# Patient Record
Sex: Female | Born: 1965 | Race: White | Hispanic: No | Marital: Married
Health system: Midwestern US, Community
[De-identification: ages and names within clinical notes are randomized; demographics above are authoritative.]

## PROBLEM LIST (undated history)

## (undated) DIAGNOSIS — I10 Essential (primary) hypertension: Secondary | ICD-10-CM

## (undated) DIAGNOSIS — E288 Other ovarian dysfunction: Secondary | ICD-10-CM

## (undated) DIAGNOSIS — T783XXA Angioneurotic edema, initial encounter: Secondary | ICD-10-CM

## (undated) DIAGNOSIS — N809 Endometriosis, unspecified: Secondary | ICD-10-CM

## (undated) DIAGNOSIS — M359 Systemic involvement of connective tissue, unspecified: Secondary | ICD-10-CM

## (undated) DIAGNOSIS — Z1231 Encounter for screening mammogram for malignant neoplasm of breast: Secondary | ICD-10-CM

## (undated) DIAGNOSIS — N6459 Other signs and symptoms in breast: Principal | ICD-10-CM

## (undated) DIAGNOSIS — N6452 Nipple discharge: Principal | ICD-10-CM

## (undated) DIAGNOSIS — D509 Iron deficiency anemia, unspecified: Principal | ICD-10-CM

## (undated) DIAGNOSIS — D649 Anemia, unspecified: Principal | ICD-10-CM

## (undated) DIAGNOSIS — M8589 Other specified disorders of bone density and structure, multiple sites: Secondary | ICD-10-CM

## (undated) DIAGNOSIS — Z13820 Encounter for screening for osteoporosis: Principal | ICD-10-CM

## (undated) DIAGNOSIS — E060 Acute thyroiditis: Secondary | ICD-10-CM

## (undated) DIAGNOSIS — E030 Congenital hypothyroidism with diffuse goiter: Secondary | ICD-10-CM

## (undated) HISTORY — DX: Essential (primary) hypertension: I10

## (undated) HISTORY — DX: Endometriosis, unspecified: N80.9

## (undated) HISTORY — PX: LAPAROSCOPY: SHX197

## (undated) HISTORY — DX: Other ovarian dysfunction: E28.8

## (undated) HISTORY — PX: CHOLECYSTECTOMY: SHX55

## (undated) HISTORY — DX: Angioneurotic edema, initial encounter: T78.3XXA

---

## 2001-06-25 DIAGNOSIS — E2839 Other primary ovarian failure: Secondary | ICD-10-CM

## 2001-06-25 HISTORY — DX: Other primary ovarian failure: E28.39

## 2010-05-16 MED ORDER — DIATRIZOATE MEGLUMINE & SODIUM 66 %-10 % ORAL SOLN
66-10 % | Freq: Once | ORAL | Status: AC
Start: 2010-05-16 — End: 2010-05-16
  Administered 2010-05-16: 22:00:00 via ORAL

## 2010-05-16 MED ORDER — IOVERSOL 350 MG/ML IV SOLN
350 mg iodine/mL | Freq: Once | INTRAVENOUS | Status: AC
Start: 2010-05-16 — End: 2010-05-16
  Administered 2010-05-16: 22:00:00 via INTRAVENOUS

## 2011-02-15 NOTE — Progress Notes (Signed)
[facility]: Office Visit New Patient H/P    Chief Complaint:    Fatigue, low fever, lymphadenopathy    History of Present Illness:  Pt is 45 yo female who presents as a self referral with abnormal labs, c/o moderate fatigue, glands swelling in axilla and behind knees which are tender to palpation, constant sore throat, and night sweats periodically.  She states that these symptoms have been occuring since 06/2010. She reports previously having EBV mono in 1996.  Pt reports last week she was in [employer] and had a sharp pain in her abdomen that radiated to her left shoulder, became diaphoretic and then felt like she was going to pass out.  She states it lasted approximately 40-45 minutes until she made it home to rest. She is concerned of her long standing symptoms with history of EBV, and worried of potential transformation.    Labs from PCP showed high EBV IgG, high nuclear Ag Ab, low EBV IgM. Normal CBC, normal lymphocyte/monocyte counts, normal LFT.    Review of Systems:  Constitutional Moderate fatigue.  Night sweats periodically. Low fever.  Denies weight loss or appetite changes. Denies anorexia.   HEENT Persistent sore throat. Denies trauma, bluring vision, hearing loss, ear pain, nosebleeds, neck pain and ear discharge.    Skin Denies lesions or rashes.   Lungs Denies shortness of breath, cough, sputum production or hemoptysis.   Cardiovascular Denies chest pain, palpitations, orthopnea, claudication and leg swelling.   Gastrointestinal Recent episode of LUQ pain   GU Denies dysuria, frequency or hesitancy of urination   Neuro Denies headaches, visual changes or ataxia. Denies dizziness, tingling, tremors, sensory change, speech change, focal weakness and headaches.     Hematology Denies nasal/gum bleeding, denies easy bruise   Endo Denies heat/cold intolerance, denies diabetes.   MSK Denies back pain, swollen legs, myalgias and falls.      Psychiatric/Behavioral Denies depression and substance abuse. The patient is not nervous/anxious.       Allergies   Allergen Reactions   ??? Compazine (Prochlorperazine Edisylate) Rash     Past Medical History   Diagnosis Date   ??? Ovarian failure 2002     premature---hormone therapy   ??? Endometriosis 1992     laparoscopy   ??? HTN (hypertension) 06/2010   ??? Cholelithiasis      cholecystectomy     Past Surgical History   Procedure Date   ??? Hx cholecystectomy 2010     cholelithiasis   ??? Hx pelvic laparoscopy 1997     endometriosis     No family history on file.  History     Social History   ??? Marital Status: Married     Spouse Name: N/A     Number of Children: N/A   ??? 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 Smoking status: Never Smoker    ??? Smokeless tobacco: Not on file   ??? Alcohol Use: Yes   ??? Drug Use: Not on file   ??? Sexually Active: Not on file     Other Topics Concern   ??? Not on file     Social History Narrative   ??? No narrative on file         OBJECTIVE:  BP 152/102   Pulse 76   Temp(Src) 97.9 ??F (36.6 ??C) (Oral)   Resp 18  Ht 5\' 1"  (1.549 m)   Wt 124 lb 12.8 oz (56.609 kg)   BMI 23.58 kg/m2   SpO2 100%    Physical Exam:  Constitutional: Oriented to person, place, and time.   Well-developed and well-nourished.    HEENT: Normocephalic and atraumatic. Oropharynx is clear and moist.   Conjunctivae and EOM are normal. Pupils are equal, round, and reactive to light. No scleral icterus. Neck supple.  No JVD present.  No tracheal deviation present. No thyromegaly present.    Lymph node   Posterior cervical lymph nodes < 1 cm, tender.  Cervical left lymph node > 1 cm. Right axillary lymph node 1 cm. Bilateral popliteal lymph nodes tender on palpation. No palpable submandibular, cervical, supraclavicular, axillary and inguinal lymph nodes.    Skin/Breast Left breast, approximately 2 o'clock position, close to axilla fine fullness with tenderness.  Warm and dry.  No bruising and no rash noted.  No erythema.  No pallor.    Respiratory Effort normal and breath sounds normal.  No respiratory distress.  No wheezes.  No rales.  No tenderness.    CVS Normal rate, regular rhythm and normal heart sounds.  Exam reveals no gallop, no friction and no rub.  No murmur heard.   Abdomen Palpable liver, 2 cm.  Positive inguinal lymph nodes bilateral. Tender on palpation.  Soft. Bowel sounds are normal. Exhibits no distension. There is no tenderness. There is no rebound and no guarding.   Neuro Normal reflexes.  No cranial nerve deficit.  Exhibits normal muscle tone, 5 of 5 strength of all extremities.   MSK Normal range of motion.  No edema and no tenderness.   Psych Normal mood, affect, behavior, judgment and thought content        ASSESSMENT:  1. EBV infection  multivitamin (ONE A DAY) tablet, BIEST / PROGESTERONE, DHEA, MICRONIZED, LD, FLOW CYTOMETRY, PERIPH BLOOD, CT NECK CHEST ABD PELV W WO CONT, MAM MAMMO BI SCREENING DIGTL, EBV BY PCR   2. Lymphadenopathy  multivitamin (ONE A DAY) tablet, BIEST / PROGESTERONE, DHEA, MICRONIZED, LD, FLOW CYTOMETRY, PERIPH BLOOD, CT NECK CHEST ABD PELV W WO CONT, MAM MAMMO BI SCREENING DIGTL, EBV BY PCR     Problem List  Date Reviewed: Mar 07, 2011      Codes Class Noted    EBV infection 075  03/07/11        Lymphadenopathy 785.6  Mar 07, 2011          45 yo F of good baseline performance had PMH of EBV infection in 1996, present today to consult for 8 months of fatigue, low fever, night sweats, lymphadenopathy. She also experienced an episode of severe LUQ pain 2 weeks ago. Concern of EBV reactivation was raised.     It is generally uncommon for EBV reactivation in adults, and she has high IgG titer that indicates immunity to EBV. Her low IgM level, normal lymphocyte/monocyte counts do not support she has active EBV infection. We will check EBV DNA by PCR to further clarify. That being said, the chronic fatigue after EBV infection can last for long time.    Meanwhile, the lymphadenopathy, low fever, hepatomegaly without clear evidence of active EBV indicate evaluation for lymphoproliferative disease. Will check CT neck/C/A/P to clarify the extent of lymphadenopathy and organomegaly, peripheral smear and flowcytometry to screen for clonal population.    Her LUQ of right breast has remarkable tenderness without clearly defined nodule. Will have diagnostic mammogram to evaluate the fullness in the area, and get  [place] if mammogram is negative.    PLAN:  - EBV reactivation: EBV PCR  - Adenopathy, hepatomegaly and LUQ pain: CT neck/C/A/P to evaluate bulky lymph nodes and liver/spleen.  - peripheral blood flow to eval monoclonal population. Check LDH.  - Left breast pain and fullness at 2'oclock: mammogram due. Will get US/biopsy if possible  - Symptom control: Supportive care, tylenol as needed            Asencion Noble, M.D.  [facility]  [address]  [place] [address]  Office : [phone]  Fax : [phone]

## 2011-02-15 NOTE — Progress Notes (Signed)
Formatting of this note is different from the original.  Images from the original note were not included.  [facility]: Office Visit New Patient H/P    Chief Complaint:    Fatigue, low fever, lymphadenopathy    History of Present Illness:  Pt is 45 yo female who presents as a self referral with abnormal labs, c/o moderate fatigue, glands swelling in axilla and behind knees which are tender to palpation, constant sore throat, and night sweats periodically.  She states that these symptoms have been occuring since 06/2010. She reports previously having EBV mono in 1996.  Pt reports last week she was in [employer] and had a sharp pain in her abdomen that radiated to her left shoulder, became diaphoretic and then felt like she was going to pass out.  She states it lasted approximately 40-45 minutes until she made it home to rest. She is concerned of her long standing symptoms with history of EBV, and worried of potential transformation.    Labs from PCP showed high EBV IgG, high nuclear Ag Ab, low EBV IgM. Normal CBC, normal lymphocyte/monocyte counts, normal LFT.    Review of Systems:  Constitutional Moderate fatigue.  Night sweats periodically. Low fever.  Denies weight loss or appetite changes. Denies anorexia.   HEENT Persistent sore throat. Denies trauma, bluring vision, hearing loss, ear pain, nosebleeds, neck pain and ear discharge.    Skin Denies lesions or rashes.   Lungs Denies shortness of breath, cough, sputum production or hemoptysis.   Cardiovascular Denies chest pain, palpitations, orthopnea, claudication and leg swelling.   Gastrointestinal Recent episode of LUQ pain   GU Denies dysuria, frequency or hesitancy of urination   Neuro Denies headaches, visual changes or ataxia. Denies dizziness, tingling, tremors, sensory change, speech change, focal weakness and headaches.    Hematology Denies nasal/gum bleeding, denies easy bruise   Endo Denies heat/cold intolerance, denies diabetes.   MSK Denies  back pain, swollen legs, myalgias and falls.    Psychiatric/Behavioral Denies depression and substance abuse. The patient is not nervous/anxious.      Allergies   Allergen Reactions   ? Compazine (Prochlorperazine Edisylate) Rash     Past Medical History   Diagnosis Date   ? Ovarian failure 2002     premature---hormone therapy   ? Endometriosis 1992     laparoscopy   ? HTN (hypertension) 06/2010   ? Cholelithiasis      cholecystectomy     Past Surgical History   Procedure Date   ? Hx cholecystectomy 2010     cholelithiasis   ? Hx pelvic laparoscopy 1997     endometriosis     No family history on file.  History     Social History   ? Marital Status: Married     Spouse Name: N/A     Number of Children: N/A   ? 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 Smoking status: Never Smoker    ? Smokeless tobacco: Not on file   ? Alcohol Use: Yes   ? Drug Use: Not on file   ? Sexually Active: Not on file     Other Topics Concern   ? Not on file     Social History Narrative   ? No narrative on file     OBJECTIVE:  BP 152/102  Pulse 76  Temp(Src) 97.9 F (36.6 C) (Oral)  Resp 18  Ht '5\' 1"'$  (1.549 m)  Wt 124 lb 12.8 oz (56.609 kg)  BMI 23.58 kg/m2  SpO2 100%    Physical Exam:  Constitutional: Oriented to person, place, and time.   Well-developed and well-nourished.    HEENT: Normocephalic and atraumatic. Oropharynx is clear and moist.   Conjunctivae and EOM are normal. Pupils are equal, round, and reactive to light. No scleral icterus. Neck supple.  No JVD present.  No tracheal deviation present. No thyromegaly present.    Lymph node   Posterior cervical lymph nodes < 1 cm, tender.  Cervical left lymph node > 1 cm. Right axillary lymph node 1 cm. Bilateral popliteal lymph nodes tender on palpation. No palpable submandibular, cervical, supraclavicular, axillary and inguinal lymph nodes.   Skin/Breast Left breast, approximately 2 o'clock position, close to axilla  fine fullness with tenderness.  Warm and dry.  No bruising and no rash noted.  No erythema.  No pallor.    Respiratory Effort normal and breath sounds normal.  No respiratory distress.  No wheezes.  No rales.  No tenderness.    CVS Normal rate, regular rhythm and normal heart sounds.  Exam reveals no gallop, no friction and no rub.  No murmur heard.   Abdomen Palpable liver, 2 cm.  Positive inguinal lymph nodes bilateral. Tender on palpation.  Soft. Bowel sounds are normal. Exhibits no distension. There is no tenderness. There is no rebound and no guarding.   Neuro Normal reflexes.  No cranial nerve deficit.  Exhibits normal muscle tone, 5 of 5 strength of all extremities.   MSK Normal range of motion.  No edema and no tenderness.   Psych Normal mood, affect, behavior, judgment and thought content      ASSESSMENT:  1. EBV infection  multivitamin (ONE A DAY) tablet, BIEST / PROGESTERONE, DHEA, MICRONIZED, LD, FLOW CYTOMETRY, PERIPH BLOOD, CT NECK CHEST ABD PELV W WO CONT, MAM MAMMO BI SCREENING DIGTL, EBV BY PCR   2. Lymphadenopathy  multivitamin (ONE A DAY) tablet, BIEST / PROGESTERONE, DHEA, MICRONIZED, LD, FLOW CYTOMETRY, PERIPH BLOOD, CT NECK CHEST ABD PELV W WO CONT, MAM MAMMO BI SCREENING DIGTL, EBV BY PCR     Problem List  Date Reviewed: 11-Mar-2011      Codes Class Noted    EBV infection 075  11-Mar-2011      Lymphadenopathy 785.6  03/11/11       45 yo F of good baseline performance had PMH of EBV infection in 1996, present today to consult for 8 months of fatigue, low fever, night sweats, lymphadenopathy. She also experienced an episode of severe LUQ pain 2 weeks ago. Concern of EBV reactivation was raised.    It is generally uncommon for EBV reactivation in adults, and she has high IgG titer that indicates immunity to EBV. Her low IgM level, normal lymphocyte/monocyte counts do not support she has active EBV infection. We will check EBV DNA by PCR to further clarify. That being said, the chronic fatigue after  EBV infection can last for long time.    Meanwhile, the lymphadenopathy, low fever, hepatomegaly without clear evidence of active EBV indicate evaluation for lymphoproliferative disease. Will check CT neck/C/A/P to clarify the extent of lymphadenopathy and organomegaly, peripheral smear and flowcytometry to screen for clonal population.    Her LUQ of right breast has remarkable tenderness without clearly defined nodule. Will have diagnostic mammogram to evaluate the fullness in the area, and get  [place] if mammogram is negative.    PLAN:  - EBV reactivation: EBV PCR  - Adenopathy, hepatomegaly and LUQ pain: CT neck/C/A/P to evaluate bulky lymph nodes and liver/spleen.  - peripheral blood flow to eval monoclonal population. Check LDH.  - Left breast pain and fullness at 2'oclock: mammogram due. Will get US/biopsy if possible  - Symptom control: Supportive care, tylenol as needed        Lindalou Hose, M.D.  [facility]  [id number]  [place] [address]  Office : [phone]  Fax : [phone]  Electronically signed by Steele Berg, MD at 02/15/2011  8:45 PM EDT

## 2011-02-19 MED ORDER — IOVERSOL 350 MG/ML IV SOLN
350 mg iodine/mL | Freq: Once | INTRAVENOUS | Status: AC
Start: 2011-02-19 — End: 2011-02-19
  Administered 2011-02-19: 13:00:00 via INTRAVENOUS

## 2011-02-19 MED ORDER — DIATRIZOATE MEGLUMINE & SODIUM 66 %-10 % ORAL SOLN
66-10 % | Freq: Once | ORAL | Status: AC
Start: 2011-02-19 — End: 2011-02-19
  Administered 2011-02-19: 13:00:00 via ORAL

## 2011-02-22 LAB — CBC WITH AUTOMATED DIFF
ABS. BASOPHILS: 0 10*3/uL (ref 0.0–0.2)
ABS. EOSINOPHILS: 0 10*3/uL (ref 0.0–0.8)
ABS. IMM. GRANS.: 0 10*3/uL (ref 0.0–0.5)
ABS. LYMPHOCYTES: 1.7 10*3/uL (ref 0.5–4.6)
ABS. MONOCYTES: 0.3 10*3/uL (ref 0.1–1.3)
ABS. NEUTROPHILS: 3 10*3/uL (ref 1.7–8.2)
BASOPHILS: 0 % (ref 0.0–2.0)
EOSINOPHILS: 1 % (ref 0.5–7.8)
HCT: 41.3 % (ref 35.8–46.3)
HGB: 14.3 g/dL (ref 11.7–15.4)
IMMATURE GRANULOCYTES: 0.2 % (ref 0.0–5.0)
LYMPHOCYTES: 33 % (ref 13–44)
MCH: 29.5 PG (ref 26.1–32.9)
MCHC: 34.6 g/dL (ref 31.4–35.0)
MCV: 85.2 FL (ref 79.6–97.8)
MONOCYTES: 6 % (ref 4.0–12.0)
MPV: 9.7 FL — ABNORMAL LOW (ref 10.8–14.1)
NEUTROPHILS: 60 % (ref 43–78)
PLATELET: 255 10*3/uL (ref 150–450)
RBC: 4.85 M/uL (ref 4.05–5.25)
RDW: 12.2 % (ref 11.9–14.6)
WBC: 5 10*3/uL (ref 4.3–11.1)

## 2011-02-22 LAB — BNP: BNP: 28 pg/mL

## 2011-02-22 LAB — CK: CK: 142 U/L (ref 21–215)

## 2011-02-22 LAB — LIPASE: Lipase: 159 U/L (ref 73–393)

## 2011-02-22 LAB — EKG, 12 LEAD, INITIAL
Atrial Rate: 80 {beats}/min
Calculated P Axis: 41 degrees
Calculated R Axis: 39 degrees
Calculated T Axis: 56 degrees
P-R Interval: 122 ms
Q-T Interval: 374 ms
QRS Duration: 74 ms
QTC Calculation (Bezet): 431 ms
Ventricular Rate: 80 {beats}/min

## 2011-02-22 LAB — METABOLIC PANEL, COMPREHENSIVE
A-G Ratio: 1.1 — ABNORMAL LOW (ref 1.2–3.5)
ALT (SGPT): 40 U/L (ref 12–65)
AST (SGOT): 28 U/L (ref 15–37)
Albumin: 4.7 g/dL (ref 3.5–5.0)
Alk. phosphatase: 73 U/L (ref 50–136)
Anion gap: 4 mmol/L — ABNORMAL LOW (ref 7–16)
BUN: 15 MG/DL (ref 6–23)
Bilirubin, total: 0.5 MG/DL (ref 0.2–1.1)
CO2: 31 MMOL/L (ref 21–32)
Calcium: 10 MG/DL (ref 8.3–10.4)
Chloride: 103 MMOL/L (ref 98–107)
Creatinine: 0.8 MG/DL (ref 0.6–1.0)
GFR est AA: >60 mL/min/{1.73_m2} (ref >60)
GFR est non-AA: >60 mL/min/{1.73_m2} (ref >60)
Globulin: 4.2 g/dL — ABNORMAL HIGH (ref 2.3–3.5)
Glucose: 80 MG/DL (ref 65–100)
Potassium: 3.7 MMOL/L (ref 3.5–5.1)
Protein, total: 8.9 g/dL — ABNORMAL HIGH (ref 6.3–8.2)
Sodium: 138 MMOL/L (ref 136–145)

## 2011-02-22 LAB — D DIMER: D DIMER: <0.19 ug/ml(FEU) (ref <0.55)

## 2011-02-22 LAB — POC TROPONIN
Troponin-I (POC): 0 ng/ml (ref 0.0–0.08)
Troponin-I (POC): 0 ng/ml (ref 0.0–0.08)

## 2011-02-22 LAB — D-DIMER, QUANTITATIVE: D-Dimer, Quant: <0.19 ug/ml(FEU) (ref <0.55)

## 2011-02-22 MED ORDER — ASPIRIN 81 MG CHEWABLE TAB
81 mg | ORAL | Status: AC
Start: 2011-02-22 — End: 2011-02-22
  Administered 2011-02-22: 15:00:00 via ORAL

## 2011-02-22 MED ORDER — SODIUM CHLORIDE 0.9% BOLUS IV
0.9 % | Freq: Once | INTRAVENOUS | Status: AC
Start: 2011-02-22 — End: 2011-02-22
  Administered 2011-02-22: 15:00:00 via INTRAVENOUS

## 2011-02-22 MED ORDER — ACETAMINOPHEN 325 MG TABLET
325 mg | ORAL | Status: AC
Start: 2011-02-22 — End: 2011-02-22
  Administered 2011-02-22: 16:00:00 via ORAL

## 2011-02-22 MED ORDER — NITROGLYCERIN 0.4 MG SUBLINGUAL TAB
0.4 mg | SUBLINGUAL | Status: DC | PRN
Start: 2011-02-22 — End: 2011-02-22
  Administered 2011-02-22: 14:00:00 via SUBLINGUAL

## 2011-02-22 NOTE — ED Notes (Signed)
She declines additional NTG "one did it". States pressure is "essentially all gone". She does c/o headache, order rec'd.

## 2011-02-22 NOTE — H&P (Addendum)
[facility]    Admitting Cardiologist: Rylie Knierim    Primary Cardiologist: n/a    Primary Care Physician: Hemberis    Subjective:     Danielle Powell is a 45 y.o. white female who  presents with c/o left sided chest pressure starting yesterday.  Intermittent with no exacerbating or alleviating factors.  No associated dyspnea, n/v, or diaphoresis.  Not exertional.   8 months hx of fatigue and chronic lymph node swelling/pain, especially left axillary. 2 days ago had near syncopal episode while at [facility].  proceeded by LUQ abd pain, diaphoresis.  No LOC.  Currently being worked up by PCP for lymphnod swelling/pain, had negative mammogram, abd/pelvice ct. Holter monitor worn for two days--results pending. Today with neg trop and neg d-dimer.  Recently started new job.  PCP d'c''d [facility] 06/2010 at onset of fatigue.        Past Medical History   Diagnosis Date   ??? Ovarian failure 2002     premature---hormone therapy   ??? Endometriosis 1992     laparoscopy   ??? HTN (hypertension) 06/2010   ??? Cholelithiasis      cholecystectomy         Mono                                                                                                                 1990's      Past Surgical History   Procedure Date   ??? Hx cholecystectomy 2010     cholelithiasis   ??? Hx pelvic laparoscopy 1997     endometriosis      Current Facility-Administered Medications   Medication Dose Route Frequency   ??? nitroglycerin (NITROSTAT) tablet 0.4 mg  0.4 mg SubLINGual Q5MIN PRN   ??? aspirin chewable tablet 324 mg  324 mg Oral NOW   ??? sodium chloride 0.9 % bolus infusion 1,000 mL  1,000 mL IntraVENous ONCE   ??? acetaminophen (TYLENOL) tablet 650 mg  650 mg Oral NOW     Current Outpatient Prescriptions   Medication Sig   ??? multivitamin (ONE A DAY) tablet Take 1 Tab by mouth daily.     ??? BIEST / PROGESTERONE Apply 1.75 mg to affected area daily. BiEst  mg / Progesterone  mg  22mg  of progesterone     ??? DHEA, MICRONIZED 10 mg by Does Not Apply route daily. cream      Allergies   Allergen Reactions   ??? Compazine (Prochlorperazine Edisylate) Rash      History   Substance Use Topics   ??? Smoking status: Never Smoker    ??? Smokeless tobacco: Not on file   ??? Alcohol Use: Yes      [place] family history of premature CAD--father deceased MI in 45's    Review of Systems  Gen: Denies fever, chills, malaise or fatigue. Appetite good.   HEENT: Denies frequent headaches, dizzyness, visual disturbances, Neck pain or swallowing difficulty  Lungs: Denies shortness of breath, hx of COPD, breathing problems  Cardiovascular: +  chest pressure, orthopnea, PND, +near syncope  GI: Denies hememesis, dark tarry stools, No prior Hx of GI bleed, Denies constipation  GU: Denies dysuria, no complaints of frequency, nocturia  Heme: No prior bleeding disorders, no prior Cancer  Neuro: Denies prior CVA, TIA.  Endocrine: no diabetes, thyroid disorders  Psychiatric: Denies anxiety, or other psychiatric illnesses.   Immunology: + lymph node swelling/pain        Objective:     BP 150/84   Pulse 61   Temp 97.6 ??F (36.4 ??C)   Resp 19   Ht 5' (1.524 m)   Wt 56.7 kg (125 lb)   BMI 24.41 kg/m2   SpO2 99%             General:Alert, cooperative, no distress, appears stated age.Head: Normocephalic, without obvious abnormality, atraumatic. Eyes: Conjunctivae/corneas clear. PERRL, EOMs intact. Fundi benign   Ears: Normal TMs and external ear canals both ears. Nose:Nares normal. Septum midline. Mucosa normal. No drainage or sinus tenderness. Throat: Lips, mucosa, and tongue normal. Teeth and gums normal. Neck: Supple, symmetrical, trachea midline, no adenopathy, thyroid: no enlargment/tenderness/nodules, no carotid bruit and no JVD. Lungs:Clear to auscultation bilaterally.  Chest wall: No tenderness or deformity  Heart: Regular rate and rhythm, S1, S2 normal, no murmur, click, rub or gallop. Abdomen:Soft, non-tender. Bowel sounds normal. No masses, No organomegaly. Extremities: Extremities normal, atraumatic, no cyanosis or edema.Pulses: 2+ and symmetric all extremities. Skin: Skin color, texture, turgor normal. No rashes or lesions                                 Lymph nodes: Cervical, supraclavicular,  nodes normal, axillary nodes TTP. Neurologic:CNII-XII intact. Normal strength, sensation and reflexes throughout.     ECG: NS, no ectopy noted    Data Review:     Recent Results (from the past 24 hour(s))   EKG, 12 LEAD, INITIAL    Collection Time    02/22/11 10:46 AM       Component Value Range    Systolic BP        Diastolic BP        Ventricular Rate 80      Atrial Rate 80      P-R Interval 122      QRS Duration 74      Q-T Interval 374      QTC Calculation (Bezet) 431      Calculated P Axis 41      Calculated R Axis 39      Calculated T Axis 56      Diagnosis        Value: !! AGE AND GENDER SPECIFIC ECG ANALYSIS !!      Normal sinus rhythm      Normal ECG      No previous ECGs available      Confirmed by [facility]  MD (PP), WAYNE M ([id number]) on 02/22/2011 11:58:05 AM   CBC WITH AUTOMATED DIFF    Collection Time    02/22/11 10:50 AM       Component Value Range    WBC 5.0  4.3 - 11.1 (K/uL)    RBC 4.85  4.05 - 5.25 (M/uL)     HGB 14.3  11.7 - 15.4 (g/dL)    HCT 16.1  09.6 - 04.5 (%)    MCV 85.2  79.6 - 97.8 (FL)    MCH 29.5  26.1 - 32.9 (PG)  MCHC 34.6  31.4 - 35.0 (g/dL)    RDW 16.1  09.6 - 04.5 (%)    PLATELET 255  150 - 450 (K/uL)    MPV 9.7 (*) 10.8 - 14.1 (FL)    DF AUTOMATED      NEUTROPHILS 60  43 - 78 (%)    LYMPHOCYTES 33  13 - 44 (%)    MONOCYTES 6  4.0 - 12.0 (%)    EOSINOPHILS 1  0.5 - 7.8 (%)    BASOPHILS 0  0.0 - 2.0 (%)    IMMATURE GRANULOCYTES 0.2  0.0 - 5.0 (%)    ABS. NEUTROPHILS 3.0  1.7 - 8.2 (K/UL)    ABS. LYMPHOCYTES 1.7  0.5 - 4.6 (K/UL)    ABS. MONOCYTES 0.3  0.1 - 1.3 (K/UL)    ABS. EOSINOPHILS 0.0  0.0 - 0.8 (K/UL)    ABS. BASOPHILS 0.0  0.0 - 0.2 (K/UL)    ABS. IMM. GRANS. 0.0  0.0 - 0.5 (K/UL)   METABOLIC PANEL, COMPREHENSIVE    Collection Time    02/22/11 10:50 AM       Component Value Range    Sodium 138  136 - 145 (MMOL/L)    Potassium 3.7  3.5 - 5.1 (MMOL/L)    Chloride 103  98 - 107 (MMOL/L)    CO2 31  21 - 32 (MMOL/L)    Anion gap 4 (*) 7 - 16 (mmol/L)    Glucose 80  65 - 100 (MG/DL)    BUN 15  6 - 23 (MG/DL)    Creatinine 0.8  0.6 - 1.0 (MG/DL)    GFR est AA >40  >98 (ml/min/1.55m2)    GFR est non-AA >60  >60 (ml/min/1.44m2)    Calcium 10.0  8.3 - 10.4 (MG/DL)    Bilirubin, total 0.5  0.2 - 1.1 (MG/DL)    ALT 40  12 - 65 (U/L)    AST 28  15 - 37 (U/L)    Alk. phosphatase 73  50 - 136 (U/L)    Protein, total 8.9 (*) 6.3 - 8.2 (g/dL)    Albumin 4.7  3.5 - 5.0 (g/dL)    Globulin 4.2 (*) 2.3 - 3.5 (g/dL)    A-G Ratio 1.1 (*) 1.2 - 3.5 ( )   CK    Collection Time    02/22/11 10:50 AM       Component Value Range    CK 142  21 - 215 (U/L)   LIPASE    Collection Time    02/22/11 10:50 AM       Component Value Range    Lipase 159  73 - 393 (U/L)   D DIMER    Collection Time    02/22/11 10:50 AM       Component Value Range    D DIMER <0.19  <0.55 (ug/ml(FEU))   BNP    Collection Time    02/22/11 10:50 AM       Component Value Range    BNP 28     POC TROPONIN-I    Collection Time    02/22/11 10:57 AM        Component Value Range    Troponin-I (POC) 0  0.0 - 0.08 (ng/ml)         Assessment / Plan     Active Problems:   Chest pain- stable, resolved after 1 SL nitroglycerin.  Obtain Stress/echo today.  Continue serial trop x 1.---patient was able to perform greater  than ten minutes of Bruce Exercise Stress protocol achieving greater than target heart rate. No acute ST/ T wave changes, no chest pain. Echo images noting no wall motion abnormality. She is discharged to home with Rx for Omeprazole and SL ntg with follow up appt with Dr. Einar Pheasant in 4 weeks.      Stated Labile BP--unclear etiology---will monitor with Stress echo today. BP today was stable--max bp with exercise 160/80. Appropriate response.     MICHAEL D CADE, NP    ATTENDING ADDENDUM:    Patient seen and examined by me.  Agree with above note by physician extender.  Key findings are:  No CP or DOE at present but chest pressure that needs evaluation in light of recurrent nature and hormone replacement therapy.  We will proceed with stress echo and echo today as long as enzymes negative.   CV- RRR without murmur  Lungs- Clear bilaterally  Abd- soft, nontender, nondistended  Ext- no edema  Assessment:    Atypical chest pain  Hypertension, ? New onset vs. Anxiety  Lymphadenopathy  Plan: As above.  If stress echo negative, we will send home with PPI, ECASA 81mg  daily and SL NTG PRN.  If more pain, consider definitive cath.    She will keep a home BP log for my review at follow up visit in several weeks.     Leonarda Salon, MD  [facility]  Pager [phone]

## 2011-02-22 NOTE — ED Provider Notes (Signed)
HPI Comments: States intermittently for several days.    Patient is a 45 y.o. female presenting with chest pain. The history is provided by the patient.   Chest Pain (Angina)   This is a recurrent problem. The current episode started more than 2 days ago. The problem has not changed since onset.Duration of episode(s) is 1 hour. The problem occurs daily. The pain is associated with normal activity. The pain is present in the substernal region. The pain is moderate. The quality of the pain is described as pressure-like. The pain radiates to the left jaw. Associated symptoms include dizziness and nausea. Pertinent negatives include no fever, no lower extremity edema, no shortness of breath and no vomiting. She has tried nothing for the symptoms. Risk factors include hormone replacement therapy and family history.        Past Medical History   Diagnosis Date   ??? Ovarian failure 2002     premature---hormone therapy   ??? Endometriosis 1992     laparoscopy   ??? HTN (hypertension) 06/2010   ??? Cholelithiasis      cholecystectomy        Past Surgical History   Procedure Date   ??? Hx cholecystectomy 2010     cholelithiasis   ??? Hx pelvic laparoscopy 1997     endometriosis         No family history on file.     History     Social History   ??? Marital Status: Married     Spouse Name: N/A     Number of Children: N/A   ??? 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 Smoking status: Never Smoker    ??? Smokeless tobacco: Not on file   ??? Alcohol Use: Yes   ??? Drug Use: Not on file   ??? Sexually Active: Not on file     Other Topics Concern   ??? Not on file     Social History Narrative   ??? No narrative on file                  ALLERGIES: Compazine      Review of Systems   Constitutional: Negative for fever.   Respiratory: Negative for shortness of breath.    Cardiovascular: Positive for chest pain.   Gastrointestinal: Positive for nausea. Negative for vomiting.    Neurological: Positive for dizziness.   All other systems reviewed and are negative.        Filed Vitals:    02/22/11 1043 02/22/11 1045   BP: 170/101    Pulse: 91    Temp:  97.6 ??F (36.4 ??C)   Resp: 18    Height: 5' (1.524 m)    Weight: 56.7 kg (125 lb)    SpO2: 99%             Physical Exam   Nursing note and vitals reviewed.  Constitutional: She is oriented to person, place, and time. She appears well-developed and well-nourished.   HENT:   Head: Normocephalic and atraumatic.   Eyes: Conjunctivae are normal.   Neck: Neck supple.   Cardiovascular: Normal rate and regular rhythm.    Pulmonary/Chest: Effort normal and breath sounds normal.   Abdominal: Soft. There is no tenderness.   Musculoskeletal: Normal range of motion.   Neurological: She is alert and oriented to person, place,  and time.   Skin: Skin is warm and dry.        MDM     Differential Diagnosis; Clinical Impression; Plan:     Pt with 2 risk factors, d/w cards, will do stress echo and dispo from cards.  Amount and/or Complexity of Data Reviewed:   Clinical lab tests:  Ordered and reviewed   Review and summarize past medical records:  Yes   Discuss the patient with another provider:  Yes   Independant visualization of image, tracing, or specimen:  Yes  Risk of Significant Complications, Morbidity, and/or Mortality:   Presenting problems:  High  Diagnostic procedures:  High  Management options:  High  Progress:   Patient progress:  Stable      Procedures

## 2011-02-22 NOTE — ED Notes (Signed)
Pt to other dept for stress echo.

## 2011-02-28 MED ORDER — ALPRAZOLAM 1 MG TAB
1 mg | ORAL_TABLET | Freq: Every evening | ORAL | Status: AC | PRN
Start: 2011-02-28 — End: ?

## 2011-02-28 NOTE — Progress Notes (Signed)
[facility]: Office Visit New Patient H/P    Chief Complaint:    Fatigue, low fever, lymphadenopathy    History of Present Illness:  Pt is 45 yo female who presents as a self referral with abnormal labs, c/o moderate fatigue, glands swelling in axilla and behind knees which are tender to palpation, constant sore throat, and night sweats periodically.  She states that these symptoms have been occuring since 06/2010. She reports previously having EBV mono in 1996.  Pt reports last week she was in [employer] and had a sharp pain in her abdomen that radiated to her left shoulder, became diaphoretic and then felt like she was going to pass out.  She states it lasted approximately 40-45 minutes until she made it home to rest. She is concerned of her long standing symptoms with history of EBV, and worried of potential transformation.    Labs from PCP showed high EBV IgG, high nuclear Ag Ab, low EBV IgM. Normal CBC, normal lymphocyte/monocyte counts, normal LFT.    In the initial counseling we had systemic workup to assess potential EBV reactivation or lymphoplastic disease. EBV PCR is negative. Peripheral flow cytometry showed no evidence of clonal cells. CT neck/chest/abdomen did not reveal pathological LAD. Mammogram showed no evidence of breast lesion.    She went to ER on 8/30 with c/o feeling dizzy, "loopy", and elevated BP.  Cardiac tests were negative.  BP remains elevated today and she reports it has been for the past few days. Admits anxiety.    Review of Systems:  Constitutional Moderate fatigue.  Night sweats periodically. Low fever.  Denies weight loss or appetite changes. Denies anorexia.   HEENT Persistent sore throat. Denies trauma, bluring vision, hearing loss, ear pain, nosebleeds, neck pain and ear discharge.    Skin Denies lesions or rashes.   Lungs Denies shortness of breath, cough, sputum production or hemoptysis.    Cardiovascular Denies chest pain, palpitations, orthopnea, claudication and leg swelling.   Gastrointestinal Recent episode of LUQ pain   GU Denies dysuria, frequency or hesitancy of urination   Neuro Denies headaches, visual changes or ataxia. Denies dizziness, tingling, tremors, sensory change, speech change, focal weakness and headaches.     Hematology Denies nasal/gum bleeding, denies easy bruise   Endo Denies heat/cold intolerance, denies diabetes.   MSK Denies back pain, swollen legs, myalgias and falls.     Psychiatric/Behavioral Denies depression and substance abuse. The patient is not nervous/anxious.       Allergies   Allergen Reactions   ??? Compazine (Prochlorperazine Edisylate) Rash     Past Medical History   Diagnosis Date   ??? Ovarian failure 2002     premature---hormone therapy   ??? Endometriosis 1992     laparoscopy   ??? HTN (hypertension) 06/2010   ??? Cholelithiasis      cholecystectomy     Past Surgical History   Procedure Date   ??? Hx cholecystectomy 2010     cholelithiasis   ??? Hx pelvic laparoscopy 1997     endometriosis     No family history on file.  History     Social History   ??? Marital Status: Married     Spouse Name: N/A     Number of Children: N/A   ??? 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 Smoking status: Never Smoker    ???  Smokeless tobacco: Not on file   ??? Alcohol Use: Yes   ??? Drug Use: Not on file   ??? Sexually Active: Not on file     Other Topics Concern   ??? Not on file     Social History Narrative   ??? No narrative on file         OBJECTIVE:  BP 147/110   Pulse 66   Temp(Src) 97.9 ??F (36.6 ??C) (Oral)   Resp 18   Ht 5' (1.524 m)   Wt 122 lb (55.339 kg)   BMI 23.83 kg/m2   SpO2 100%   LMP 06/25/2001    Physical Exam:  Constitutional: Oriented to person, place, and time.   Well-developed and well-nourished.    HEENT: Normocephalic and atraumatic. Oropharynx is clear and moist.    Conjunctivae and EOM are normal. Pupils are equal, round, and reactive to light. No scleral icterus. Neck supple.  No JVD present.  No tracheal deviation present. No thyromegaly present.    Lymph node   Posterior cervical lymph nodes < 1 cm, tender.   Right axillary lymph node <1 cm. Bilateral popliteal lymph nodes tender on palpation. No palpable submandibular, cervical, supraclavicular, axillary and inguinal lymph nodes.   Skin/Breast Left breast, approximately 2 o'clock position, close to axilla 0.5cm nodule with tenderness.  Warm and dry.  No bruising and no rash noted.  No erythema.  No pallor.    Respiratory Effort normal and breath sounds normal.  No respiratory distress.  No wheezes.  No rales.  No tenderness.    CVS Normal rate, regular rhythm and normal heart sounds.  Exam reveals no gallop, no friction and no rub.  No murmur heard.   Abdomen Palpable liver, 2 cm.  Positive inguinal lymph nodes bilateral. Tender on palpation.  Soft. Bowel sounds are normal. Exhibits no distension. There is no tenderness. There is no rebound and no guarding.   Neuro Normal reflexes.  No cranial nerve deficit.  Exhibits normal muscle tone, 5 of 5 strength of all extremities.   MSK Normal range of motion.  No edema and no tenderness.   Psych Normal mood, affect, behavior, judgment and thought content        ASSESSMENT:  1. Breast nodule  US BREAST LT, REFERRAL TO GENERAL SURGERY     Problem List  Date Reviewed: March 02, 2011      Codes Class Noted    EBV infection 075  2011-03-02        Lymphadenopathy 785.6  03/02/2011          45 yo F of good baseline performance had PMH of EBV infection in 1996, present today to consult for 8 months of fatigue, low fever, night sweats, lymphadenopathy. She also experienced an episode of severe LUQ pain 2 weeks ago. Concern of EBV reactivation was raised.     It is generally uncommon for EBV reactivation in adults, and she has high IgG titer that indicates immunity to EBV. Her low IgM level, normal lymphocyte/monocyte counts do not support she has active EBV infection. We checked EBV DNA by PCR and further ruled out reactivation. She probably has chronic fatigue, which can last for long time after EBV infection. We also ruled out lymphoproliferative disease with negative CT and flow cytometry.    Her LUQ of right breast has remarkable tenderness with a 0.5 cm small nodule palpable and tender, but negative mammogram. I marked the site and ordered a [place] to further evaluate. Will also refer  to breast surgeon for the persistent tenderness.    PLAN:  -Anxiety: Prescription for Xanax  -Ultra sound of left breast  -Referral to Dr. Myrtice Lauth for small tender nodule on left breast              Asencion Noble, M.D.  [facility]  [address], [address]  [place] [address]  Office : [phone]  Fax : [phone]

## 2011-02-28 NOTE — Progress Notes (Signed)
Formatting of this note is different from the original.  Images from the original note were not included.  [facility]: Office Visit New Patient H/P    Chief Complaint:    Fatigue, low fever, lymphadenopathy    History of Present Illness:  Pt is 45 yo female who presents as a self referral with abnormal labs, c/o moderate fatigue, glands swelling in axilla and behind knees which are tender to palpation, constant sore throat, and night sweats periodically.  She states that these symptoms have been occuring since 06/2010. She reports previously having EBV mono in 1996.  Pt reports last week she was in [employer] and had a sharp pain in her abdomen that radiated to her left shoulder, became diaphoretic and then felt like she was going to pass out.  She states it lasted approximately 40-45 minutes until she made it home to rest. She is concerned of her long standing symptoms with history of EBV, and worried of potential transformation.    Labs from PCP showed high EBV IgG, high nuclear Ag Ab, low EBV IgM. Normal CBC, normal lymphocyte/monocyte counts, normal LFT.    In the initial counseling we had systemic workup to assess potential EBV reactivation or lymphoplastic disease. EBV PCR is negative. Peripheral flow cytometry showed no evidence of clonal cells. CT neck/chest/abdomen did not reveal pathological LAD. Mammogram showed no evidence of breast lesion.    She went to ER on 8/30 with c/o feeling dizzy, "loopy", and elevated BP.  Cardiac tests were negative.  BP remains elevated today and she reports it has been for the past few days. Admits anxiety.    Review of Systems:  Constitutional Moderate fatigue.  Night sweats periodically. Low fever.  Denies weight loss or appetite changes. Denies anorexia.   HEENT Persistent sore throat. Denies trauma, bluring vision, hearing loss, ear pain, nosebleeds, neck pain and ear discharge.    Skin Denies lesions or rashes.   Lungs Denies shortness of breath, cough,  sputum production or hemoptysis.   Cardiovascular Denies chest pain, palpitations, orthopnea, claudication and leg swelling.   Gastrointestinal Recent episode of LUQ pain   GU Denies dysuria, frequency or hesitancy of urination   Neuro Denies headaches, visual changes or ataxia. Denies dizziness, tingling, tremors, sensory change, speech change, focal weakness and headaches.    Hematology Denies nasal/gum bleeding, denies easy bruise   Endo Denies heat/cold intolerance, denies diabetes.   MSK Denies back pain, swollen legs, myalgias and falls.    Psychiatric/Behavioral Denies depression and substance abuse. The patient is not nervous/anxious.      Allergies   Allergen Reactions   ? Compazine (Prochlorperazine Edisylate) Rash     Past Medical History   Diagnosis Date   ? Ovarian failure 2002     premature---hormone therapy   ? Endometriosis 1992     laparoscopy   ? HTN (hypertension) 06/2010   ? Cholelithiasis      cholecystectomy     Past Surgical History   Procedure Date   ? Hx cholecystectomy 2010     cholelithiasis   ? Hx pelvic laparoscopy 1997     endometriosis     No family history on file.  History     Social History   ? Marital Status: Married     Spouse Name: N/A     Number of Children: N/A   ? 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 Smoking status: Never Smoker    ? Smokeless tobacco: Not on file   ? Alcohol Use: Yes   ? Drug Use: Not on file   ? Sexually Active: Not on file     Other Topics Concern   ? Not on file     Social History Narrative   ? No narrative on file     OBJECTIVE:  BP 147/110  Pulse 66  Temp(Src) 97.9 F (36.6 C) (Oral)  Resp 18  Ht 5' (1.524 m)  Wt 122 lb (55.339 kg)  BMI 23.83 kg/m2  SpO2 100%  LMP 06/25/2001    Physical Exam:  Constitutional: Oriented to person, place, and time.   Well-developed and well-nourished.    HEENT: Normocephalic and atraumatic. Oropharynx is clear and moist.   Conjunctivae and EOM  are normal. Pupils are equal, round, and reactive to light. No scleral icterus. Neck supple.  No JVD present.  No tracheal deviation present. No thyromegaly present.    Lymph node   Posterior cervical lymph nodes < 1 cm, tender.   Right axillary lymph node <1 cm. Bilateral popliteal lymph nodes tender on palpation. No palpable submandibular, cervical, supraclavicular, axillary and inguinal lymph nodes.   Skin/Breast Left breast, approximately 2 o'clock position, close to axilla 0.5cm nodule with tenderness.  Warm and dry.  No bruising and no rash noted.  No erythema.  No pallor.    Respiratory Effort normal and breath sounds normal.  No respiratory distress.  No wheezes.  No rales.  No tenderness.    CVS Normal rate, regular rhythm and normal heart sounds.  Exam reveals no gallop, no friction and no rub.  No murmur heard.   Abdomen Palpable liver, 2 cm.  Positive inguinal lymph nodes bilateral. Tender on palpation.  Soft. Bowel sounds are normal. Exhibits no distension. There is no tenderness. There is no rebound and no guarding.   Neuro Normal reflexes.  No cranial nerve deficit.  Exhibits normal muscle tone, 5 of 5 strength of all extremities.   MSK Normal range of motion.  No edema and no tenderness.   Psych Normal mood, affect, behavior, judgment and thought content      ASSESSMENT:  1. Breast nodule  US BREAST LT, REFERRAL TO GENERAL SURGERY     Problem List  Date Reviewed: 02/20/2011      Codes Class Noted    EBV infection 075  2011-02-20      Lymphadenopathy 785.6  02-20-11       45 yo F of good baseline performance had PMH of EBV infection in 1996, present today to consult for 8 months of fatigue, low fever, night sweats, lymphadenopathy. She also experienced an episode of severe LUQ pain 2 weeks ago. Concern of EBV reactivation was raised.    It is generally uncommon for EBV reactivation in adults, and she has high IgG titer that indicates immunity to EBV. Her low IgM level, normal lymphocyte/monocyte counts  do not support she has active EBV infection. We checked EBV DNA by PCR and further ruled out reactivation. She probably has chronic fatigue, which can last for long time after EBV infection. We also ruled out lymphoproliferative disease with negative CT and flow cytometry.    Her LUQ of right breast has remarkable tenderness with a 0.5 cm small nodule palpable and tender, but negative mammogram. I marked the site and ordered a [place] to further evaluate. Will also  refer to breast surgeon for the persistent tenderness.    PLAN:  -Anxiety: Prescription for Xanax  -Ultra sound of left breast  -Referral to Dr. Sheria Lang for small tender nodule on left breast        Lindalou Hose, M.D.  [facility]  [address], [id number]  [place] [address]  Office : [phone]  Fax : [phone]  Electronically signed by Steele Berg, MD at 02/28/2011  6:19 PM EDT

## 2015-09-12 ENCOUNTER — Other Ambulatory Visit: Payer: Self-pay | Admitting: Family Medicine

## 2015-09-12 ENCOUNTER — Encounter: Payer: Self-pay | Admitting: Gastroenterology

## 2015-09-12 DIAGNOSIS — R1030 Lower abdominal pain, unspecified: Secondary | ICD-10-CM

## 2015-09-12 DIAGNOSIS — R197 Diarrhea, unspecified: Secondary | ICD-10-CM

## 2015-09-13 ENCOUNTER — Ambulatory Visit
Admission: RE | Admit: 2015-09-13 | Discharge: 2015-09-13 | Disposition: A | Discharge status: Home / Self Care | Payer: Managed Care, Other (non HMO) | Source: Ambulatory Visit | Attending: Family Medicine | Admitting: Family Medicine

## 2015-09-13 DIAGNOSIS — R197 Diarrhea, unspecified: Secondary | ICD-10-CM

## 2015-09-13 DIAGNOSIS — R1030 Lower abdominal pain, unspecified: Secondary | ICD-10-CM

## 2015-09-13 MED ORDER — IOPAMIDOL (ISOVUE-300) INJECTION 61%
100.0000 mL | Freq: Once | INTRAVENOUS | Status: AC | PRN
  Administered 2015-09-13: 100 mL via INTRAVENOUS

## 2015-11-01 ENCOUNTER — Ambulatory Visit: Payer: Self-pay | Admitting: Gastroenterology

## 2016-06-20 ENCOUNTER — Ambulatory Visit: Payer: Managed Care, Other (non HMO) | Admitting: Neurology

## 2016-08-01 ENCOUNTER — Ambulatory Visit (INDEPENDENT_AMBULATORY_CARE_PROVIDER_SITE_OTHER): Payer: Managed Care, Other (non HMO) | Admitting: Obstetrics & Gynecology

## 2016-08-01 ENCOUNTER — Other Ambulatory Visit (HOSPITAL_COMMUNITY)
Admission: RE | Admit: 2016-08-01 | Discharge: 2016-08-01 | Disposition: A | Discharge status: Home / Self Care | Payer: Managed Care, Other (non HMO) | Source: Ambulatory Visit | Attending: Obstetrics & Gynecology | Admitting: Obstetrics & Gynecology

## 2016-08-01 ENCOUNTER — Encounter: Payer: Self-pay | Admitting: Obstetrics & Gynecology

## 2016-08-01 VITALS — BP 138/79 | HR 74 | Ht 60.0 in | Wt 140.0 lb

## 2016-08-01 DIAGNOSIS — Z0000 Encounter for general adult medical examination without abnormal findings: Secondary | ICD-10-CM

## 2016-08-01 DIAGNOSIS — T783XXA Angioneurotic edema, initial encounter: Secondary | ICD-10-CM | POA: Insufficient documentation

## 2016-08-01 DIAGNOSIS — N952 Postmenopausal atrophic vaginitis: Secondary | ICD-10-CM | POA: Insufficient documentation

## 2016-08-01 DIAGNOSIS — Z01419 Encounter for gynecological examination (general) (routine) without abnormal findings: Secondary | ICD-10-CM

## 2016-08-01 DIAGNOSIS — N841 Polyp of cervix uteri: Secondary | ICD-10-CM

## 2016-08-01 DIAGNOSIS — E288 Other ovarian dysfunction: Secondary | ICD-10-CM

## 2016-08-01 DIAGNOSIS — T783XXD Angioneurotic edema, subsequent encounter: Secondary | ICD-10-CM

## 2016-08-01 DIAGNOSIS — M8580 Other specified disorders of bone density and structure, unspecified site: Secondary | ICD-10-CM | POA: Insufficient documentation

## 2016-08-01 DIAGNOSIS — E2839 Other primary ovarian failure: Secondary | ICD-10-CM | POA: Insufficient documentation

## 2016-08-01 MED ORDER — ESTRADIOL 10 MCG VA TABS: ORAL_TABLET | VAGINAL | 12 refills | Status: DC

## 2016-08-01 NOTE — Progress Notes (Signed)
Subjective:    Carolyn JonesKathleen Ewing is a 51 y.o. female who presents for an annual exam. The patient has complaints of vaginal pain, dyspareunia after stopping HRT.  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  Pt was told to stop hormones as a possible cause. The patient is not currently sexually active. GYN screening history: last pap: was normal. The patient wears seatbelts: yes.   The patietn would like to try vaginal estrogen to see if this helps her pain.  She has noticed some clear discharge.  No bleeding.  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  Menstrual History: OB History    Gravida Para Term Preterm AB Living   0 0 0 0 0 0   SAB TAB Ectopic Multiple Live Births   0 0 0 0 0      Menarche age: [age] No LMP recorded. Patient is postmenopausal.    The following portions of the patient's history were reviewed and updated as appropriate: allergies, current medications, past family history, past medical history, past social history, past surgical history and problem list.  Review of Systems Pertinent items noted in HPI and remainder of comprehensive ROS otherwise negative.    Objective:      Vitals:   08/01/16 1046  BP: 138/79  Pulse: 74  Weight: 140 lb (63.5 kg)  Height: 5' (1.524 m)   Vitals:  WNL General appearance: alert, cooperative and no distress  HEENT: Normocephalic, without obvious abnormality, atraumatic Eyes: negative Throat: lips, mucosa, and tongue normal; teeth and gums normal  Respiratory: Clear to auscultation bilaterally  CV: Regular rate and rhythm  Breasts:  Normal appearance, no masses or tenderness, no nipple retraction or dimpling  GI: Soft, non-tender; bowel sounds normal; no masses,  no organomegaly  GU: External Genitalia:  Tanner V, no lesion Urethra:  No prolapse   Vagina: Pale pink, tender with exam, watery discharge, endocervical polyp  Cervix: No CMT, no lesion  Uterus:  Normal size and  contour, non tender  Adnexa: Normal, no masses, non tender  Musculoskeletal: No edema, redness or tenderness in the calves or thighs  Skin: No lesions or rash  Lymphatic: Axillary adenopathy: none     Psychiatric: Normal mood and behavior    .    Assessment:    Healthy female exam.   Endocervical Polyp Atrophic vaginitis   Plan:     1.  Mammogram 2.  Dexa and continue Ca++ and Vit D 3.  vagifem 4.  Removal endocervical polyp   Removal of endocervical polyp  Procedure explained in detail and informed consent obtained.  Risks include bleeding and infection.  Small sub centimeter polyp grasped with ring forceps and twisted off.  Specimen handed off to RN.  Hemostasis controlled with direct pressure.

## 2016-08-03 LAB — CERVICOVAGINAL ANCILLARY ONLY
BACTERIAL VAGINITIS: NEGATIVE
Candida vaginitis: NEGATIVE
Trichomonas: NEGATIVE

## 2016-08-06 LAB — CYTOLOGY - PAP
Diagnosis: NEGATIVE
HPV: NOT DETECTED

## 2016-08-07 ENCOUNTER — Telehealth: Payer: Self-pay | Admitting: *Deleted

## 2016-08-07 NOTE — Telephone Encounter (Signed)
-----   Message from Lesly DukesKelly H Leggett, MD sent at 08/04/2016  1:15 PM EST ----- Vaginitis negative.  RN to call.

## 2016-08-07 NOTE — Telephone Encounter (Signed)
-----   Message from Kelly H Leggett, MD sent at 08/04/2016  1:15 PM EST ----- Vaginitis negative.  RN to call. 

## 2016-08-07 NOTE — Telephone Encounter (Signed)
Pt notified of aptima results of being neg and that she needs to get her mammogram and BMD scheduled here @ [facility].

## 2016-08-24 ENCOUNTER — Ambulatory Visit (INDEPENDENT_AMBULATORY_CARE_PROVIDER_SITE_OTHER): Payer: Managed Care, Other (non HMO)

## 2016-08-24 ENCOUNTER — Ambulatory Visit: Payer: Managed Care, Other (non HMO)

## 2016-08-24 ENCOUNTER — Other Ambulatory Visit: Payer: Managed Care, Other (non HMO)

## 2016-08-24 ENCOUNTER — Ambulatory Visit: Payer: Managed Care, Other (non HMO) | Admitting: Neurology

## 2016-08-24 DIAGNOSIS — Z1231 Encounter for screening mammogram for malignant neoplasm of breast: Secondary | ICD-10-CM

## 2016-08-24 DIAGNOSIS — Z0000 Encounter for general adult medical examination without abnormal findings: Secondary | ICD-10-CM

## 2016-08-24 DIAGNOSIS — E2839 Other primary ovarian failure: Secondary | ICD-10-CM

## 2016-08-24 DIAGNOSIS — E288 Other ovarian dysfunction: Principal | ICD-10-CM

## 2016-08-24 DIAGNOSIS — M85852 Other specified disorders of bone density and structure, left thigh: Secondary | ICD-10-CM | POA: Diagnosis not present

## 2016-08-24 DIAGNOSIS — M8588 Other specified disorders of bone density and structure, other site: Secondary | ICD-10-CM | POA: Diagnosis not present

## 2016-08-28 ENCOUNTER — Telehealth: Payer: Self-pay | Admitting: *Deleted

## 2016-08-28 NOTE — Telephone Encounter (Signed)
Pt notified of Bone Dex results of osteopenia.  Pt states that she has had that since age 51.  She does not tolerate Calcium tab very well but does eat yogurt everyday and will take her Vitamin D supplements.  Encouraged weight bearing exercises daily.

## 2016-08-28 NOTE — Telephone Encounter (Signed)
-----   Message from Lesly DukesKelly H Leggett, MD sent at 08/27/2016  3:07 PM EST ----- Osteopenia:  Pt needs to ensure good calcium and vitamin D intake and weight bearing exercise.  Rpt DEXA in 2 year.  RN to call.   FRAX# 10-year Probability of Fracture Based on femoral neck BMD: DualFemur (Left)  Major Osteoporotic Fracture: 11.3% Hip Fracture:        2.5% Population:         [place] ([place] Risk Factors:        History of Fracture (Adult)

## 2016-08-28 NOTE — Telephone Encounter (Signed)
-----   Message from Kelly H Leggett, MD sent at 08/27/2016  3:07 PM EST ----- Osteopenia:  Pt needs to ensure good calcium and vitamin D intake and weight bearing exercise.  Rpt DEXA in 2 year.  RN to call.   FRAX# 10-year Probability of Fracture Based on femoral neck BMD: DualFemur (Left)  Major Osteoporotic Fracture: 11.3% Hip Fracture:        2.5% Population:         [place] (Caucasian) Risk Factors:        History of Fracture (Adult) 

## 2016-08-30 ENCOUNTER — Other Ambulatory Visit: Payer: Self-pay | Admitting: Obstetrics & Gynecology

## 2016-08-30 NOTE — Progress Notes (Signed)
Entered in error

## 2016-09-04 ENCOUNTER — Encounter: Payer: Self-pay | Admitting: Neurology

## 2016-09-04 ENCOUNTER — Ambulatory Visit (INDEPENDENT_AMBULATORY_CARE_PROVIDER_SITE_OTHER): Payer: Managed Care, Other (non HMO) | Admitting: Neurology

## 2016-09-04 VITALS — BP 134/82 | HR 68 | Ht 60.0 in | Wt 145.4 lb

## 2016-09-04 DIAGNOSIS — M318 Other specified necrotizing vasculopathies: Secondary | ICD-10-CM | POA: Diagnosis not present

## 2016-09-04 DIAGNOSIS — Z820 Family history of epilepsy and other diseases of the nervous system: Secondary | ICD-10-CM

## 2016-09-04 DIAGNOSIS — H9201 Otalgia, right ear: Secondary | ICD-10-CM

## 2016-09-04 DIAGNOSIS — R202 Paresthesia of skin: Secondary | ICD-10-CM | POA: Diagnosis not present

## 2016-09-04 DIAGNOSIS — R200 Anesthesia of skin: Secondary | ICD-10-CM | POA: Diagnosis not present

## 2016-09-04 DIAGNOSIS — T783XXD Angioneurotic edema, subsequent encounter: Secondary | ICD-10-CM

## 2016-09-04 DIAGNOSIS — R2681 Unsteadiness on feet: Secondary | ICD-10-CM

## 2016-09-04 DIAGNOSIS — L958 Other vasculitis limited to the skin: Secondary | ICD-10-CM
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
## 2016-09-04 NOTE — Patient Instructions (Signed)
First, we will get MRI of the brain with and without contrast.  If that is unremarkable, I would likely want to check a nerve conduction study.  I will also get lab results from Dr. Hyman HopesWebb.  Follow up in about 2 months but we will continue with workup until then, pending results of MRI.

## 2016-09-13 ENCOUNTER — Inpatient Hospital Stay: Admission: RE | Admit: 2016-09-13 | Payer: Managed Care, Other (non HMO) | Source: Ambulatory Visit

## 2016-10-01 ENCOUNTER — Telehealth: Payer: Self-pay

## 2016-10-01 ENCOUNTER — Ambulatory Visit
Admission: RE | Admit: 2016-10-01 | Discharge: 2016-10-01 | Disposition: A | Discharge status: Home / Self Care | Payer: Managed Care, Other (non HMO) | Source: Ambulatory Visit | Attending: Neurology | Admitting: Neurology

## 2016-10-01 DIAGNOSIS — H9201 Otalgia, right ear: Secondary | ICD-10-CM

## 2016-10-01 DIAGNOSIS — G629 Polyneuropathy, unspecified: Secondary | ICD-10-CM

## 2016-10-01 DIAGNOSIS — Z820 Family history of epilepsy and other diseases of the nervous system: Secondary | ICD-10-CM

## 2016-10-01 DIAGNOSIS — R202 Paresthesia of skin: Secondary | ICD-10-CM

## 2016-10-01 DIAGNOSIS — R200 Anesthesia of skin: Secondary | ICD-10-CM

## 2016-10-01 DIAGNOSIS — R2681 Unsteadiness on feet: Secondary | ICD-10-CM

## 2016-10-01 MED ORDER — GADOBENATE DIMEGLUMINE 529 MG/ML IV SOLN
13.0000 mL | Freq: Once | INTRAVENOUS | Status: AC | PRN
  Administered 2016-10-01: 13 mL via INTRAVENOUS

## 2016-10-01 NOTE — Telephone Encounter (Signed)
Spoke to patient. Gave results and instructions.  Will have front office to call and schedule NCV/EMG.

## 2016-10-01 NOTE — Telephone Encounter (Signed)
-----   Message from Drema Dallas, DO sent at 10/01/2016 11:02 AM EDT ----- MRI of brain is normal.  I would like to check NCV-EMG to evaluate for polyneuropathy (paresthesia)

## 2016-10-02 NOTE — Telephone Encounter (Signed)
This encounter was created in error - please disregard.

## 2016-10-16 ENCOUNTER — Ambulatory Visit (INDEPENDENT_AMBULATORY_CARE_PROVIDER_SITE_OTHER): Payer: Managed Care, Other (non HMO) | Admitting: Neurology

## 2016-10-16 DIAGNOSIS — G629 Polyneuropathy, unspecified: Secondary | ICD-10-CM

## 2016-10-16 NOTE — Procedures (Signed)
[facility]  [address] [place], [address]  [place], [place] [address] Tel: [phone] Fax:  [phone] Test Date:  10/16/2016  Patient: Carolyn Ewing DOB: [birth year] Physician: Nita Sickle, DO  Sex: Female Height: 5' " Ref Phys: Shon Millet, D.O.  ID#: [id number] Temp: 34.5C Technician:    Patient Complaints: This is a 51 year-old female referred for evaluation of intermittent generalized paresthesias of the hands and feet.  NCV & EMG Findings: Extensive electrodiagnostic testing of the right upper and lower extremity shows:  1. All sensory responses including the right median, ulnar, mixed palmer, sural, and superficial peroneal nerves are within normal limits.  2. All motor responses including the right median, ulnar, peroneal, and tibial nerves are within normal limits.  3. Right tibial H reflex study is within normal limits.  4. There is no evidence of active or chronic motor axon loss changes affecting any of the tested muscles. Motor unit configuration and recruitment pattern is within normal limits.   Impression: This is a normal study.   In particular, there is no evidence of a large fiber sensorimotor polyneuropathy, carpal tunnel syndrome, or cervical/lumbosacral radiculopathy affecting the right side.   ___________________________ Nita Sickle, DO    Nerve Conduction Studies Anti Sensory Summary Table   Stim Site NR Peak (ms) Norm Peak (ms) P-T Amp (V) Norm P-T Amp  Right Median Anti Sensory (2nd Digit)  Wrist    3.0 <3.4 49.7 >20  Right Sup Peroneal Anti Sensory (Ant Lat Mall)  12 cm    2.9 <4.5 10.3 >5  Right Sural Anti Sensory (Lat Mall)  Calf    3.2 <4.5 9.6 >5  Right Ulnar Anti Sensory (5th Digit)  Wrist    2.4 <3.1 36.0 >12   Motor Summary Table   Stim Site NR Onset (ms) Norm Onset (ms) O-P Amp (mV) Norm O-P Amp Site1 Site2 Delta-0 (ms) Dist (cm) Vel (m/s) Norm Vel (m/s)  Right Median Motor (Abd Poll Brev)  Wrist    3.0 <3.9 8.0 >6 Elbow  Wrist 4.0 23.0 58 >50  Elbow    7.0  7.8         Right Peroneal Motor (Ext Dig Brev)  Ankle    5.1 <5.5 4.3 >3 B Fib Ankle 5.8 32.0 55 >40  B Fib    10.9  3.9  Poplt B Fib 1.4 8.0 57 >40  Poplt    12.3  3.7         Right Tibial Motor (Abd Hall Brev)  Ankle    3.3 <6.0 12.6 >8 Knee Ankle 7.6 31.0 41 >40  Knee    10.9  9.1         Right Ulnar Motor (Abd Dig Minimi)  Wrist    2.0 <3.1 12.9 >7 B Elbow Wrist 3.3 20.0 61 >50  B Elbow    5.3  12.2  A Elbow B Elbow 1.9 10.0 53 >50  A Elbow    7.2  12.2          Comparison Summary Table   Stim Site NR Peak (ms) Norm Peak (ms) P-T Amp (V) Site1 Site2 Delta-P (ms) Norm Delta (ms)  Right Median/Ulnar Palm Comparison (Wrist - 8cm)  Median Palm    1.6 <2.2 70.3 Median Palm Ulnar Palm 0.1   Ulnar Palm    1.5 <2.2 19.7       H Reflex Studies   NR H-Lat (ms) Lat Norm (ms) L-R H-Lat (ms)  Right Tibial (  Gastroc)     33.47 <35    EMG   Side Muscle Ins Act Fibs Psw Fasc Number Recrt Dur Dur. Amp Amp. Poly Poly. Comment  Right 1stDorInt Nml Nml Nml Nml Nml Nml Nml Nml Nml Nml Nml Nml N/A  Right AntTibialis Nml Nml Nml Nml Nml Nml Nml Nml Nml Nml Nml Nml N/A  Right Gastroc Nml Nml Nml Nml Nml Nml Nml Nml Nml Nml Nml Nml N/A  Right Flex Dig Long Nml Nml Nml Nml Nml Nml Nml Nml Nml Nml Nml Nml N/A  Right RectFemoris Nml Nml Nml Nml Nml Nml Nml Nml Nml Nml Nml Nml N/A  Right GluteusMed Nml Nml Nml Nml Nml Nml Nml Nml Nml Nml Nml Nml N/A  Right BicepsFemS Nml Nml Nml Nml Nml Nml Nml Nml Nml Nml Nml Nml N/A  Right Ext Indicis Nml Nml Nml Nml Nml Nml Nml Nml Nml Nml Nml Nml N/A  Right PronatorTeres Nml Nml Nml Nml Nml Nml Nml Nml Nml Nml Nml Nml N/A  Right Triceps Nml Nml Nml Nml Nml Nml Nml Nml Nml Nml Nml Nml N/A  Right Biceps Nml Nml Nml Nml Nml Nml Nml Nml Nml Nml Nml Nml N/A  Right Deltoid Nml Nml Nml Nml Nml Nml Nml Nml Nml Nml Nml Nml N/A      Waveforms:

## 2016-10-19 ENCOUNTER — Other Ambulatory Visit: Payer: Self-pay

## 2016-10-19 ENCOUNTER — Telehealth: Payer: Self-pay

## 2016-10-19 DIAGNOSIS — G629 Polyneuropathy, unspecified: Secondary | ICD-10-CM

## 2016-10-19 NOTE — Telephone Encounter (Signed)
MRI cervical spine approved.  Case #[id number], approval #[id number].  [facility] will contact patient.

## 2016-10-25 ENCOUNTER — Ambulatory Visit: Payer: Managed Care, Other (non HMO) | Admitting: Neurology

## 2016-10-30 ENCOUNTER — Encounter: Payer: Self-pay | Admitting: Obstetrics & Gynecology

## 2016-10-30 ENCOUNTER — Ambulatory Visit (INDEPENDENT_AMBULATORY_CARE_PROVIDER_SITE_OTHER): Payer: Managed Care, Other (non HMO) | Admitting: Obstetrics & Gynecology

## 2016-10-30 VITALS — BP 130/87 | HR 72 | Resp 16 | Ht 60.0 in | Wt 147.0 lb

## 2016-10-30 DIAGNOSIS — R102 Pelvic and perineal pain: Secondary | ICD-10-CM

## 2016-10-30 NOTE — Progress Notes (Signed)
   Subjective:    Patient ID: Carolyn Ewing, female    DOB: [birth year], 51 y.o.   MRN: [id number]  HPI  Presents for follow-up of vaginal pain. Patient tried to have intercourse and was unable to. Patient did not get estrogen filled. Patient has not had any postmenopausal bleeding. The estrogen was very expensive and this is why she didn't get filled. Patient denies discharge.  Patient is complaining of decreased libido.  Patient also had a lesion on her left vulva a few weeks ago. It was very painful and lasted about a week. She did not come to the doctor. Patient does not have a history of herpes but this sounds somewhat suspicious. Patient will come in with next lesion.  Review of Systems  Constitutional: Negative.   Respiratory: Negative.   Cardiovascular: Negative.   Gastrointestinal: Negative.   Genitourinary: Positive for vaginal pain. Negative for vaginal bleeding and vaginal discharge.       Objective:   Physical Exam  Constitutional: She is oriented to person, place, and time. She appears well-developed and well-nourished. No distress.  HENT:  Head: Normocephalic and atraumatic.  Eyes: Conjunctivae are normal.  Pulmonary/Chest: Effort normal.  Abdominal: Soft. She exhibits no distension. There is no tenderness.  Genitourinary:  Genitourinary Comments: Genitalia Tanner 5 Vulva no lesion Introitus no pain Left side of vaginal vault extremely tight and painful to touch right side with no tightness of vaginal wall. Mucosa spell pink with minimal discharge no inflammation Cervix is close nontender Uterus is small mobile and nontender. No adnexal masses appreciated. Cervical polyp is gone.  Musculoskeletal: She exhibits no edema.  Neurological: She is alert and oriented to person, place, and time.  Skin: Skin is warm and dry.  Psychiatric: She has a normal mood and affect.  Vitals reviewed.  Vitals:   10/30/16 1521  BP: 130/87  Pulse: 72  Resp: 16  Weight: 147 lb (66.7  kg)  Height: 5' (1.524 m)    Assessment & Plan:  51 year old female with vaginal pain 1. Exam consistent with pelvic floor dysfunction. Will refer to pelvic physical therapy. 2. Patient return to clinic in 2 months. If still having issues will explore using lidocaine and topical Estrace. 3. Patient to come in with vulvar lesion if it recurs.  25 minutes spent face-to-face with patient with greater than 50% counseling.

## 2016-10-31 ENCOUNTER — Ambulatory Visit
Admission: RE | Admit: 2016-10-31 | Discharge: 2016-10-31 | Disposition: A | Discharge status: Home / Self Care | Payer: Managed Care, Other (non HMO) | Source: Ambulatory Visit | Attending: Neurology | Admitting: Neurology

## 2016-10-31 DIAGNOSIS — G629 Polyneuropathy, unspecified: Secondary | ICD-10-CM

## 2016-11-05 ENCOUNTER — Encounter: Payer: Self-pay | Admitting: Physical Therapy

## 2016-11-05 ENCOUNTER — Ambulatory Visit: Payer: Managed Care, Other (non HMO) | Attending: Obstetrics & Gynecology | Admitting: Physical Therapy

## 2016-11-05 DIAGNOSIS — M6281 Muscle weakness (generalized): Secondary | ICD-10-CM | POA: Diagnosis present

## 2016-11-05 DIAGNOSIS — M62838 Other muscle spasm: Secondary | ICD-10-CM | POA: Insufficient documentation

## 2016-11-05 DIAGNOSIS — R278 Other lack of coordination: Secondary | ICD-10-CM | POA: Diagnosis present

## 2016-11-05 NOTE — Patient Instructions (Signed)
About Abdominal Massage  Abdominal massage, also called external colon massage, is a self-treatment circular massage technique that can reduce and eliminate gas and ease constipation. The colon naturally contracts in waves in a clockwise direction starting from inside the right hip, moving up toward the ribs, across the belly, and down inside the left hip.  When you perform circular abdominal massage, you help stimulate your colon's normal wave pattern of movement called peristalsis.  It is most beneficial when done after eating.  Positioning You can practice abdominal massage with oil while lying down, or in the shower with soap.  Some people find that it is just as effective to do the massage through clothing while sitting or standing.  How to Massage Start by placing your finger tips or knuckles on your right side, just inside your hip bone.  . Make small circular movements while you move upward toward your rib cage.   . Once you reach the bottom right side of your rib cage, take your circular movements across to the left side of the bottom of your rib cage.  . Next, move downward until you reach the inside of your left hip bone.  This is the path your feces travel in your colon. . Continue to perform your abdominal massage in this pattern for 10 minutes each day.     You can apply as much pressure as is comfortable in your massage.  Start gently and build pressure as you continue to practice.  Notice any areas of pain as you massage; areas of slight pain may be relieved as you massage, but if you have areas of significant or intense pain, consult with your healthcare provider.  Other Considerations . General physical activity including bending and stretching can have a beneficial massage-like effect on the colon.  Deep breathing can also stimulate the colon because breathing deeply activates the same nervous system that supplies the colon.   . Abdominal massage should always be used in  combination with a bowel-conscious diet that is high in the proper type of fiber for you, fluids (primarily water), and a regular exercise program.  Relaxation Exercises with the Urge to Void   When you experience an urge to void:  FIRST  Stop and stand very still    Sit down if you can    Don't move    You need to stay very still to maintain control  SECOND Squeeze your pelvic floor muscles 5 times, like a quick flick, to keep from leaking  THIRD Relax  Take a deep breath and then let it out  Try to make the urge go away by using relaxation and visualization techniques  FINALLY When you feel the urge go away somewhat, walk normally to the bathroom.   If the urge gets suddenly stronger on the way, you may stop again and relax to regain control.

## 2016-11-06 NOTE — Therapy (Signed)
[facility] [address], [address] [place], [place], [address] Phone: [phone]   Fax:  [phone]  Physical Therapy Evaluation  Patient Details  Name: Carolyn Ewing MRN: [id number] Date of Birth: 1965/08/27 Referring Provider: Elsie Lincoln  Encounter Date: 11/05/2016      PT End of Session - 11/05/16 1437    Visit Number 1   Number of Visits 20   Date for PT Re-Evaluation 02/25/17   PT Start Time 1438   PT Stop Time 1524   PT Time Calculation (min) 46 min      Past Medical History:  Diagnosis Date  . Angioedema   . Endometriosis   . Hypertension   . Premature ovarian failure 2003    Past Surgical History:  Procedure Laterality Date  . CHOLECYSTECTOMY    . LAPAROSCOPY      There were no vitals filed for this visit.       Subjective Assessment - 11/05/16 1442    Subjective When I have been off the hormones I have noticed the pain more and I thought it was my hormones causing it.  I have always had issues with my bowels (constipation).  It hurts when I have sex and it has been getting worse.   Pertinent History Goes by Carolyn Ewing"; endometriosis, cholecystectomy   Patient Stated Goals get rid of the pain   Currently in Pain? Yes   Pain Score 3    Pain Location Abdomen   Pain Orientation Left;Lower   Pain Descriptors / Indicators Discomfort   Pain Type Chronic pain   Pain Radiating Towards down left thigh   Pain Onset More than a month ago   Pain Frequency Constant   Aggravating Factors  intercourse   Pain Relieving Factors lying down with pillow under knees   Effect of Pain on Daily Activities intercourse with husband, constant pain with all activities   Multiple Pain Sites No            [facility] PT Assessment - 11/05/16 0001      Assessment   Medical Diagnosis R10.2 pelvic pain    Referring Provider Elsie Lincoln   Onset Date/Surgical Date --  over 6 months   Prior Therapy no     Precautions    Precautions None     Restrictions   Weight Bearing Restrictions No     Balance Screen   Has the patient fallen in the past 6 months No     Home Environment   Living Environment Private residence   Living Arrangements Spouse/significant other     Prior Function   Level of Independence Independent     Cognition   Overall Cognitive Status Within Functional Limits for tasks assessed     Observation/Other Assessments   Focus on Therapeutic Outcomes (FOTO)  17% limited     Posture/Postural Control   Posture/Postural Control Postural limitations   Postural Limitations Increased lumbar lordosis;Anterior pelvic tilt     ROM / Strength   AROM / PROM / Strength AROM;Strength     AROM   Overall AROM Comments WFL     Strength   Overall Strength Comments abdominal strength 4/5; hip extension, abduction and adduction 4/5 left, 5/5 right     Palpation   SI assessment  left SI joint tenderness   Palpation comment abdominal tension left> right     Ambulation/Gait   Gait Pattern Within Functional Limits  Pelvic Floor Special Questions - 11/05/16 0001    Prior Pelvic/Prostate Exam Yes   Date of Last Pelvic/Prostate Exam 10/23/16   Are you Pregnant or attempting pregnancy? No   Prior Pregnancies Yes   Currently Sexually Active Yes   Is this Painful Yes   History of sexually transmitted disease Yes   Marinoff Scale pain prevents any attempts at intercourse  10/10   Urinary Leakage Yes   Pad use no   Activities that cause leaking With strong urge;Coughing;Sneezing;Laughing   Urinary urgency Yes   Urinary frequency no   Fecal incontinence No  constipation   Fluid intake 6 -10 glasses   Falling out feeling (prolapse) No   Pelvic Floor Internal Exam pt informed and consent given to perform internal pelvic floor assessment   Exam Type Vaginal   Sensation normal   Palpation tender to palpation left transverse peroneus, levator ani, obdurator internis    Strength Flicker   Strength # of reps 2   Strength # of seconds 3   Tone high          [facility] Adult PT Treatment/Exercise - 11/05/16 0001      Self-Care   Self-Care Other Self-Care Comments   Other Self-Care Comments  abdominal massage, urge to void                PT Education - 11/05/16 1524    Education provided Yes   Education Details abdominal massage and urge to void   Person(s) Educated Patient   Methods Explanation;Demonstration;Verbal cues;Handout;Tactile cues   Comprehension Verbalized understanding          PT Short Term Goals - 11/06/16 0618      PT SHORT TERM GOAL #1   Title Pt will be independent with initial HEP   Time 4   Period Weeks   Status New     PT SHORT TERM GOAL #2   Title Pt will be able to contract and bulge pelvic floor due to increased muscle length and ability to relax pelvic floor   Time 4   Period Weeks   Status New     PT SHORT TERM GOAL #3   Title Pt will be able to peform self massage to pelvic floor muscle with 50% decreased pain   Time 4   Period Weeks   Status New           PT Long Term Goals - 11/05/16 1610      PT LONG TERM GOAL #1   Title FOTO < or = to 10% limitation   Time 12   Period Weeks   Status New     PT LONG TERM GOAL #2   Title independent with advanced HEP for return to execise routine without increased muscle spasms   Time 12   Period Weeks   Status New     PT LONG TERM GOAL #3   Title Marinoff scale 1/3 for decreased pain with intercourse   Time 12   Period Weeks   Status New     PT LONG TERM GOAL #4   Title Pt will be able to perform 2/5 pelvic floor contraction and hold it for at least 10 seconds for improved bladder control   Time 12   Period Weeks   Status New     PT LONG TERM GOAL #5   Title Pt will report at least 70% reduction in pain overall for improved quality of life   Time 12  Period Weeks   Status New               Plan - 11/05/16 1527    Clinical  Impression Statement The patient presents to the clinic with pelvic pain that is a constant discomfot in lower abdomen with moments where it shoots up to 10/10 pain in vaginal region during intercourse.  Pt is 3/3 on Marinoff scale.  Pt has hip weakness 4/5 MMT and Pelvic floor strength 1/5 MMT.  Patient has AROM WFL bilateral LE and lumbar region.  She has lack of coordination of pelvic floor contraction and is unable to lift or bulge pelvic floor muscles due to high tone.  Pt experiences constipation and urinary leakage with forceful activities such as coughing and sneezing.  Patient is moderate complexity due to needing to address posture, core, hip and pelvic floor weaknesses and muscle spasms.  History of abdominal surgeries and chronic pain are also complicating factors.  Patients symptoms are evolving and have been getting worse.  Patient will benefit from skilled PT in order to return to functional activities with improved bladde control and decreased pain.   Rehab Potential Excellent   Clinical Impairments Affecting Rehab Potential history of abdominal surgeries 2x; chronic pain   PT Frequency 2x / week   PT Duration 12 weeks   PT Treatment/Interventions ADLs/Self Care Home Management;Biofeedback;Cryotherapy;Electrical Stimulation;Iontophoresis 4mg /ml Dexamethasone;Moist Heat;Traction;Ultrasound;Gait training;Stair training;Functional mobility training;Therapeutic activities;Therapeutic exercise;Balance training;Neuromuscular re-education;Patient/family education;Manual techniques;Passive range of motion;Dry needling;Taping   PT Next Visit Plan abdominal and adductor STM; pelvic floor stretches; toileting techniques; biofeedback;    Recommended Other Services N/A   Consulted and Agree with Plan of Care Patient      Patient will benefit from skilled therapeutic intervention in order to improve the following deficits and impairments:  Pain, Impaired tone, Decreased strength, Increased muscle  spasms  Visit Diagnosis: Other muscle spasm  Muscle weakness (generalized)  Other lack of coordination     Problem List Patient Active Problem List   Diagnosis Date Noted  . Premature ovarian failure 08/01/2016  . Osteopenia 08/01/2016  . Atrophic vaginitis 08/01/2016  . 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 [place], [place], [address] Phone: [phone]   Fax:  [phone]  Name: Eber JonesKathleen Shippy MRN: [id number] Date of Birth: 1965-10-29

## 2016-11-08 ENCOUNTER — Encounter: Payer: Self-pay | Admitting: Physical Therapy

## 2016-11-08 ENCOUNTER — Ambulatory Visit: Payer: Managed Care, Other (non HMO) | Admitting: Physical Therapy

## 2016-11-08 ENCOUNTER — Telehealth: Payer: Self-pay

## 2016-11-08 DIAGNOSIS — M62838 Other muscle spasm: Secondary | ICD-10-CM | POA: Diagnosis not present

## 2016-11-08 DIAGNOSIS — R278 Other lack of coordination: Secondary | ICD-10-CM

## 2016-11-08 DIAGNOSIS — M6281 Muscle weakness (generalized): Secondary | ICD-10-CM

## 2016-11-08 NOTE — Therapy (Signed)
[facility] [address] [place], [place], [address] Phone: [phone]   Fax:  [phone]  Physical Therapy Treatment  Patient Details  Name: Carolyn Ewing MRN: [id number] Date of Birth: Apr 15, 1966 Referring Provider: Silas Sacramento  Encounter Date: 11/08/2016      PT End of Session - 11/08/16 1251    Visit Number 2   Number of Visits 20   Date for PT Re-Evaluation 02/25/17   PT Start Time 5859   PT Stop Time 1231   PT Time Calculation (min) 40 min   Activity Tolerance Patient tolerated treatment well   Behavior During Therapy [facility] for tasks assessed/performed      Past Medical History:  Diagnosis Date  . Angioedema   . Endometriosis   . Hypertension   . Premature ovarian failure 2003    Past Surgical History:  Procedure Laterality Date  . CHOLECYSTECTOMY    . LAPAROSCOPY      There were no vitals filed for this visit.      Subjective Assessment - 11/08/16 1255    Subjective No changes.  I have been doing the abdominal massage.  Denies pain currently   Pertinent History Goes by "Carolyn Ewing"; endometriosis, cholecystectomy   Patient Stated Goals get rid of the pain   Currently in Pain? No/denies                         [facility] Adult PT Treatment/Exercise - 11/08/16 0001      Neuro Re-ed    Neuro Re-ed Details  diaphragmatic breathing with focus on expanding pelvic floor in supine and child's pose     Lumbar Exercises: Stretches   Single Knee to Chest Stretch 5 reps  on foam noodles   Lower Trunk Rotation 5 reps  on foam noodles     Manual Therapy   Manual Therapy Soft tissue mobilization;Myofascial release   Manual therapy comments supine   Soft tissue mobilization adductors bilaterally   Myofascial Release abdominal urogenitial diaphragm and thoracic diaphragm                PT Education - 11/08/16 1235    Education provided Yes   Education Details rolling on  legs, pelvic floor meditation, child's pose   Person(s) Educated Patient   Methods Explanation;Demonstration;Tactile cues;Verbal cues;Handout   Comprehension Verbalized understanding;Returned demonstration          PT Short Term Goals - 11/08/16 1249      PT SHORT TERM GOAL #1   Title Pt will be independent with initial HEP   Time 4   Period Weeks   Status On-going     PT SHORT TERM GOAL #2   Title Pt will be able to contract and bulge pelvic floor due to increased muscle length and ability to relax pelvic floor   Time 4   Period Weeks   Status On-going     PT SHORT TERM GOAL #3   Title Pt will be able to peform self massage to pelvic floor muscle with 50% decreased pain   Time 4   Period Weeks   Status On-going           PT Long Term Goals - 11/08/16 1249      PT LONG TERM GOAL #1   Title FOTO < or = to 10% limitation   Time 12   Period Weeks   Status On-going     PT LONG TERM GOAL #  2   Title independent with advanced HEP for return to execise routine without increased muscle spasms   Time 12   Period Weeks   Status On-going     PT LONG TERM GOAL #3   Title Marinoff scale 1/3 for decreased pain with intercourse   Time 12   Period Weeks   Status On-going     PT LONG TERM GOAL #4   Title Pt will be able to perform 2/5 pelvic floor contraction and hold it for at least 10 seconds for improved bladder control   Time 12   Period Weeks   Status On-going     PT LONG TERM GOAL #5   Title Pt will report at least 70% reduction in pain overall for improved quality of life   Time 12   Period Weeks   Status On-going               Plan - 11/08/16 1251    Clinical Impression Statement Patient has not noticed any changes or met goals due to first treatment since eval.  Pt has upper abdominal tightness with decreased tone in lower abdomen.  She responded well to [facility] and stretches using foam roller and able to find some trigger points on her own.  Pt needs  skilled PT to improve muscle length and coordination to return to functional activiites.   Rehab Potential Excellent   Clinical Impairments Affecting Rehab Potential history of abdominal surgeries 2x; chronic pain   PT Treatment/Interventions ADLs/Self Care Home Management;Biofeedback;Cryotherapy;Electrical Stimulation;Iontophoresis 1m/ml Dexamethasone;Moist Heat;Traction;Ultrasound;Gait training;Stair training;Functional mobility training;Therapeutic activities;Therapeutic exercise;Balance training;Neuromuscular re-education;Patient/family education;Manual techniques;Passive range of motion;Dry needling;Taping   PT Next Visit Plan toilet techniques, bulging pelvic floor, butterfly stretch, cat cow, f/u on HEP   Consulted and Agree with Plan of Care Patient      Patient will benefit from skilled therapeutic intervention in order to improve the following deficits and impairments:  Pain, Impaired tone, Decreased strength, Increased muscle spasms  Visit Diagnosis: Other muscle spasm  Muscle weakness (generalized)  Other lack of coordination     Problem List Patient Active Problem List   Diagnosis Date Noted  . Premature ovarian failure 08/01/2016  . Osteopenia 08/01/2016  . Atrophic vaginitis 08/01/2016  . 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 [place] [address]Phone: [phone]  Fax:  [phone] Name: Carolyn GaneMRN: [id number] of Birth: [birth year]

## 2016-11-08 NOTE — Patient Instructions (Signed)
YOU TUBE: pelvic floor meditation  - femme fusion fitness    Breath in and expand the pelvis and think about hips spreading apart    Can also use smaller roll like pool noodle and can roll on it when sitting

## 2016-11-08 NOTE — Telephone Encounter (Signed)
Spoke with patient and gave her the MRI results per Dr. Everlena CooperJaffe.  Patient rescheduled her cancelled appointment to discuss these for 11/12/2016

## 2016-11-08 NOTE — Telephone Encounter (Signed)
-----   Message from Drema DallasAdam R Jaffe, DO sent at 11/08/2016 12:46 PM EDT ----- The MRI of cervical spine shows a disc bulge touching the spinal cord.  However, there is no inflammation or swelling of the spinal cord itself.  I don't think that is causing the numbness and tingling.  I was going to discuss these results at her scheduled follow up tomorrow, but it appears that she had cancelled the appointment.

## 2016-11-09 ENCOUNTER — Ambulatory Visit: Payer: Managed Care, Other (non HMO) | Admitting: Neurology

## 2016-11-12 ENCOUNTER — Ambulatory Visit (INDEPENDENT_AMBULATORY_CARE_PROVIDER_SITE_OTHER): Payer: Managed Care, Other (non HMO) | Admitting: Neurology

## 2016-11-12 ENCOUNTER — Ambulatory Visit: Payer: Managed Care, Other (non HMO) | Admitting: Physical Therapy

## 2016-11-12 ENCOUNTER — Encounter: Payer: Self-pay | Admitting: Neurology

## 2016-11-12 VITALS — BP 114/60 | HR 72 | Ht 60.0 in | Wt 148.0 lb

## 2016-11-12 DIAGNOSIS — R200 Anesthesia of skin: Secondary | ICD-10-CM

## 2016-11-12 DIAGNOSIS — R202 Paresthesia of skin: Secondary | ICD-10-CM | POA: Diagnosis not present

## 2016-11-12 DIAGNOSIS — M62838 Other muscle spasm: Secondary | ICD-10-CM | POA: Diagnosis not present

## 2016-11-12 DIAGNOSIS — M6281 Muscle weakness (generalized): Secondary | ICD-10-CM

## 2016-11-12 DIAGNOSIS — R278 Other lack of coordination: Secondary | ICD-10-CM

## 2016-11-12 NOTE — Patient Instructions (Signed)
BACK: Child's Pose (Sciatica)    Sit in knee-chest position and reach arms forward. Separate knees for comfort. Hold position for _60__ breaths. Repeat _2__ times. Do _2__ times per day.  Copyright  VHI. All rights reserved.      Piriformis Stretch, Supine    Lie supine, one ankle crossed onto opposite knee. Holding bottom leg behind knee, gently pull legs toward chest until stretch is felt in buttock of top leg. Hold _30__ seconds. For deeper stretch gently push top knee away from body.  Repeat _2__ times per session. Do _2__ sessions per day.  Copyright  VHI. All rights reserved.    Supine Knee-to-Chest, Unilateral    Lie on back, hands clasped behind one knee. Pull knee in toward chest until a comfortable stretch is felt in lower back and buttocks. Hold 30___ seconds.  Repeat __2_ times per session. Do _1__ sessions per day.     Butterfly, Supine    Lie on back, feet together. Lower knees toward floor. Hold 60___ seconds. Repeat _2__ times per session. Do _1__ sessions per day.  Copyright  VHI. All rights reserved.   Toileting Techniques for Bowel Movements (Defecation) Using your belly (abdomen) and pelvic floor muscles to have a bowel movement is usually instinctive.  Sometimes people can have problems with these muscles and have to relearn proper defecation (emptying) techniques.  If you have weakness in your muscles, organs that are falling out, decreased sensation in your pelvis, or ignore your urge to go, you may find yourself straining to have a bowel movement.  You are straining if you are: . holding your breath or taking in a huge gulp of air and holding it  . keeping your lips and jaw tensed and closed tightly . turning red in the face because of excessive pushing or forcing . developing or worsening your  hemorrhoids . getting faint while pushing . not emptying completely and have to defecate many times a day  If you are straining, you are actually  making it harder for yourself to have a bowel movement.  Many people find they are pulling up with the pelvic floor muscles and closing off instead of opening the anus. Due to lack pelvic floor relaxation and coordination the abdominal muscles, one has to work harder to push the feces out.  Many people have never been taught how to defecate efficiently and effectively.  Notice what happens to your body when you are having a bowel movement.  While you are sitting on the toilet pay attention to the following areas: . Jaw and mouth position . Angle of your hips   . Whether your feet touch the ground or not . Arm placement  . Spine position . Waist . Belly tension . Anus (opening of the anal canal)  An Evacuation/Defecation Plan   Here are the 4 basic points:  1. Lean forward enough for your elbows to rest on your knees 2. Support your feet on the floor or use a low stool if your feet don't touch the floor  3. Push out your belly as if you have swallowed a beach ball-you should feel a widening of your waist 4. Open and relax your pelvic floor muscles, rather than tightening around the anus      The following conditions my require modifications to your toileting posture:  . If you have had surgery in the past that limits your back, hip, pelvic, knee or ankle flexibility . Constipation   Your healthcare practitioner  may make the following additional suggestions and adjustments:  1) Sit on the toilet  a) Make sure your feet are supported. b) Notice your hip angle and spine position-most people find it effective to lean forward or raise their knees, which can help the muscles around the anus to relax  c) When you lean forward, place your forearms on your thighs for support  2) Relax suggestions a) Breath deeply in through your nose and out slowly through your mouth as if you are smelling the flowers and blowing out the candles. b) To become aware of how to relax your muscles, contracting  and releasing muscles can be helpful.  Pull your pelvic floor muscles in tightly by using the image of holding back gas, or closing around the anus (visualize making a circle smaller) and lifting the anus up and in.  Then release the muscles and your anus should drop down and feel open. Repeat 5 times ending with the feeling of relaxation. c) Keep your pelvic floor muscles relaxed; let your belly bulge out. d) The digestive tract starts at the mouth and ends at the anal opening, so be sure to relax both ends of the tube.  Place your tongue on the roof of your mouth with your teeth separated.  This helps relax your mouth and will help to relax the anus at the same time.  3) Empty (defecation) a) Keep your pelvic floor and sphincter relaxed, then bulge your anal muscles.  Make the anal opening wide.  b) Stick your belly out as if you have swallowed a beach ball. c) Make your belly wall hard using your belly muscles while continuing to breathe. Doing this makes it easier to open your anus. d) Breath out and give a grunt (or try using other sounds such as ahhhh, shhhhh, ohhhh or grrrrrrr).  4) Finish a) As you finish your bowel movement, pull the pelvic floor muscles up and in.  This will leave your anus in the proper place rather than remaining pushed out and down. If you leave your anus pushed out and down, it will start to feel as though that is normal and give you incorrect signals about needing to have a bowel movement.    [facility] [address] [place], [place] [address]
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
## 2016-11-12 NOTE — Therapy (Signed)
[facility] [address] [place], [place], [address] Phone: [phone]   Fax:  [phone]  Physical Therapy Treatment  Patient Details  Name: Ruthetta Koopmann MRN: [id number] Date of Birth: [birth year] Referring Provider: Elsie Lincoln  Encounter Date: 11/12/2016      PT End of Session - 11/12/16 1145    Visit Number 3   Number of Visits 20   Date for PT Re-Evaluation 02/25/17   PT Start Time 1102   PT Stop Time 1143   PT Time Calculation (min) 41 min   Activity Tolerance Patient tolerated treatment well   Behavior During Therapy [facility] for tasks assessed/performed      Past Medical History:  Diagnosis Date  . Angioedema   . Endometriosis   . Hypertension   . Premature ovarian failure 2003    Past Surgical History:  Procedure Laterality Date  . CHOLECYSTECTOMY    . LAPAROSCOPY      There were no vitals filed for this visit.      Subjective Assessment - 11/12/16 1127    Subjective I did not get the foam roller yet.  I haven't had any changes and have to attempted intercourse yet.   Pertinent History Goes by Olegario Messier"; endometriosis, cholecystectomy   Patient Stated Goals get rid of the pain   Currently in Pain? No/denies                         [facility] Adult PT Treatment/Exercise - 11/12/16 0001      Self-Care   Other Self-Care Comments  toilet techniques     Neuro Re-ed    Neuro Re-ed Details  breathing with stretches as shown in HEP     Manual Therapy   Manual Therapy Soft tissue mobilization;Myofascial release;Internal Pelvic Floor   Manual therapy comments supine   Soft tissue mobilization see below   Myofascial Release see below muscles   Internal Pelvic Floor pt informed and consent give to perform manual techniques to internal pelvic floor soft tissue: obdurator, ischiocavernosis, levators  left side, ischio and hipo adductors also performed external                PT Education - 11/12/16 1141    Education Details toilet techniques and pelvic floor stretches   Person(s) Educated Patient   Methods Explanation;Demonstration;Tactile cues;Verbal cues;Handout   Comprehension Verbalized understanding;Returned demonstration          PT Short Term Goals - 11/12/16 1145      PT SHORT TERM GOAL #1   Title Pt will be independent with initial HEP   Time 4   Period Weeks   Status On-going     PT SHORT TERM GOAL #2   Title Pt will be able to contract and bulge pelvic floor due to increased muscle length and ability to relax pelvic floor   Time 4   Period Weeks   Status On-going     PT SHORT TERM GOAL #3   Title Pt will be able to peform self massage to pelvic floor muscle with 50% decreased pain   Time 4   Period Weeks   Status On-going           PT Long Term Goals - 11/12/16 1146      PT LONG TERM GOAL #1   Title FOTO < or = to 10% limitation   Time 12   Period Weeks   Status On-going  PT LONG TERM GOAL #2   Title independent with advanced HEP for return to execise routine without increased muscle spasms   Time 12   Period Weeks   Status On-going     PT LONG TERM GOAL #3   Title Marinoff scale 1/3 for decreased pain with intercourse   Time 12   Period Weeks   Status On-going     PT LONG TERM GOAL #4   Title Pt will be able to perform 2/5 pelvic floor contraction and hold it for at least 10 seconds for improved bladder control   Time 12   Period Weeks   Status On-going     PT LONG TERM GOAL #5   Title Pt will report at least 70% reduction in pain overall for improved quality of life   Time 12   Period Weeks   Status On-going               Plan - 11/12/16 1142    Clinical Impression Statement Patient had muscle spasm to ischiocavernosis and obdurator internis, hip adductors on left side.  Responded well to internal and external manual techniques to release.  Pt understands stretches and toileting techniques for  reduced symptoms and to maintain lower tone in pelvic floor.    Clinical Impairments Affecting Rehab Potential history of abdominal surgeries 2x; chronic pain   PT Treatment/Interventions ADLs/Self Care Home Management;Biofeedback;Cryotherapy;Electrical Stimulation;Iontophoresis 4mg /ml Dexamethasone;Moist Heat;Traction;Ultrasound;Gait training;Stair training;Functional mobility training;Therapeutic activities;Therapeutic exercise;Balance training;Neuromuscular re-education;Patient/family education;Manual techniques;Passive range of motion;Dry needling;Taping   PT Next Visit Plan bulging pelvic floor, squat form, review stretch, foam rolling to adductors, STM internally   Recommended Other Services add cat cow with internal rotation      Patient will benefit from skilled therapeutic intervention in order to improve the following deficits and impairments:  Pain, Impaired tone, Decreased strength, Increased muscle spasms  Visit Diagnosis: Other muscle spasm  Muscle weakness (generalized)  Other lack of coordination     Problem List Patient Active Problem List   Diagnosis Date Noted  . Premature ovarian failure 08/01/2016  . Osteopenia 08/01/2016  . Atrophic vaginitis 08/01/2016  . Idiopathic angioedema 08/01/2016    Vincente PoliJakki Crosser, PT 11/12/2016, 11:49 AM  [facility] [address] [place], [place], [address] Phone: [phone]   Fax:  [phone]  Name: Eber JonesKathleen Kerlin MRN: [id number] Date of Birth: 14-May-1966

## 2016-11-15 ENCOUNTER — Encounter: Payer: Self-pay | Admitting: Physical Therapy

## 2016-11-15 ENCOUNTER — Ambulatory Visit: Payer: Managed Care, Other (non HMO) | Admitting: Physical Therapy

## 2016-11-15 DIAGNOSIS — M62838 Other muscle spasm: Secondary | ICD-10-CM

## 2016-11-15 DIAGNOSIS — R278 Other lack of coordination: Secondary | ICD-10-CM

## 2016-11-15 DIAGNOSIS — M6281 Muscle weakness (generalized): Secondary | ICD-10-CM

## 2016-11-15 NOTE — Patient Instructions (Signed)
   Let the knee drop inwards and hold 30 seconds Repeat 3x each side  [facility] [address], [address] [place], [place] [address] Phone # [phone] Fax [phone]

## 2016-11-15 NOTE — Therapy (Signed)
[facility] [address] [place], [place], [address] Phone: [phone]   Fax:  [phone]  Physical Therapy Treatment  Patient Details  Name: Carolyn JonesKathleen Ewing MRN: [id number] Date of Birth: 28-Oct-1965 Referring Provider: Elsie LincolnKelly Leggett  Encounter Date: 11/15/2016      PT End of Session - 11/15/16 1108    Visit Number 4   Number of Visits 20   Date for PT Re-Evaluation 02/25/17   PT Start Time 1101   PT Stop Time 1141   PT Time Calculation (min) 40 min   Activity Tolerance Patient tolerated treatment well   Behavior During Therapy [facility] for tasks assessed/performed      Past Medical History:  Diagnosis Date  . Angioedema   . Endometriosis   . Hypertension   . Premature ovarian failure 2003    Past Surgical History:  Procedure Laterality Date  . CHOLECYSTECTOMY    . LAPAROSCOPY      There were no vitals filed for this visit.      Subjective Assessment - 11/15/16 1106    Subjective My right hip got a ping in it when working out yesterday while doing squats.     Pertinent History Goes by Carolyn Ewing"Carolyn Ewing"; endometriosis, cholecystectomy   Patient Stated Goals get rid of the pain   Currently in Pain? No/denies                         [facility] Adult PT Treatment/Exercise - 11/15/16 0001      Lumbar Exercises: Supine   Other Supine Lumbar Exercises internal rotation stretch in supine 3 x 30sec     Lumbar Exercises: Quadruped   [place] 10 reps  with internal rotation     Manual Therapy   Manual Therapy Internal Pelvic Floor   Manual therapy comments pt informed and consent given to perform internal pelvic floor STM   Myofascial Release foam rolling educated and performed   Internal Pelvic Floor obudurator internus , ischiocavernosis                PT Education - 11/15/16 1143    Education provided Yes   Education Details internal hip rotation stretch   Person(s) Educated  Patient   Methods Explanation;Handout;Demonstration;Tactile cues;Verbal cues   Comprehension Verbalized understanding;Returned demonstration          PT Short Term Goals - 11/12/16 1145      PT SHORT TERM GOAL #1   Title Pt will be independent with initial HEP   Time 4   Period Weeks   Status On-going     PT SHORT TERM GOAL #2   Title Pt will be able to contract and bulge pelvic floor due to increased muscle length and ability to relax pelvic floor   Time 4   Period Weeks   Status On-going     PT SHORT TERM GOAL #3   Title Pt will be able to peform self massage to pelvic floor muscle with 50% decreased pain   Time 4   Period Weeks   Status On-going           PT Long Term Goals - 11/12/16 1146      PT LONG TERM GOAL #1   Title FOTO < or = to 10% limitation   Time 12   Period Weeks   Status On-going     PT LONG TERM GOAL #2   Title independent with advanced HEP for return  to execise routine without increased muscle spasms   Time 12   Period Weeks   Status On-going     PT LONG TERM GOAL #3   Title Marinoff scale 1/3 for decreased pain with intercourse   Time 12   Period Weeks   Status On-going     PT LONG TERM GOAL #4   Title Pt will be able to perform 2/5 pelvic floor contraction and hold it for at least 10 seconds for improved bladder control   Time 12   Period Weeks   Status On-going     PT LONG TERM GOAL #5   Title Pt will report at least 70% reduction in pain overall for improved quality of life   Time 12   Period Weeks   Status On-going               Plan - 11/15/16 1144    Clinical Impression Statement Patient had more tightness today and was experiencing msucle spasms since her exercise class yesterday.  Responded well to [facility] and was educated on performing self massage.  Pt continues to need skilled PT for lengthening soft tissue and increased core strength.   Clinical Impairments Affecting Rehab Potential history of abdominal surgeries  2x; chronic pain   PT Treatment/Interventions ADLs/Self Care Home Management;Biofeedback;Cryotherapy;Electrical Stimulation;Iontophoresis 4mg /ml Dexamethasone;Moist Heat;Traction;Ultrasound;Gait training;Stair training;Functional mobility training;Therapeutic activities;Therapeutic exercise;Balance training;Neuromuscular re-education;Patient/family education;Manual techniques;Passive range of motion;Dry needling;Taping   PT Next Visit Plan bulging pelvic floor, core strength and stability exercises, f/u on how internal STM is going, manual as needed   Consulted and Agree with Plan of Care Patient      Patient will benefit from skilled therapeutic intervention in order to improve the following deficits and impairments:  Pain, Impaired tone, Decreased strength, Increased muscle spasms  Visit Diagnosis: Other muscle spasm  Muscle weakness (generalized)  Other lack of coordination     Problem List Patient Active Problem List   Diagnosis Date Noted  . Premature ovarian failure 08/01/2016  . Osteopenia 08/01/2016  . Atrophic vaginitis 08/01/2016  . Idiopathic angioedema 08/01/2016    Vincente Poli, PT 11/15/2016, 2:12 PM  [facility] [address], [address] [place], [place], [address] Phone: [phone]   Fax:  [phone]  Name: Carolyn Ewing MRN: [id number] Date of Birth: 04/27/1966

## 2016-11-22 ENCOUNTER — Encounter: Payer: Managed Care, Other (non HMO) | Admitting: Physical Therapy

## 2016-11-26 ENCOUNTER — Encounter: Payer: Self-pay | Admitting: Physical Therapy

## 2016-11-26 ENCOUNTER — Ambulatory Visit: Payer: Managed Care, Other (non HMO) | Attending: Obstetrics & Gynecology | Admitting: Physical Therapy

## 2016-11-26 DIAGNOSIS — M6281 Muscle weakness (generalized): Secondary | ICD-10-CM

## 2016-11-26 DIAGNOSIS — M62838 Other muscle spasm: Secondary | ICD-10-CM

## 2016-11-26 DIAGNOSIS — R278 Other lack of coordination: Secondary | ICD-10-CM

## 2016-11-26 NOTE — Therapy (Signed)
[facility] [address] [place], [place], [address] Phone: [phone]   Fax:  [phone]  Physical Therapy Treatment  Patient Details  Name: Carolyn Ewing MRN: [id number] Date of Birth: 01/11/1966 Referring Provider: Elsie Lincoln  Encounter Date: 11/26/2016      PT End of Session - 11/26/16 1106    Visit Number 5   Date for PT Re-Evaluation 02/25/17   PT Start Time 1100   PT Stop Time 1142   PT Time Calculation (min) 42 min   Activity Tolerance Patient tolerated treatment well   Behavior During Therapy [facility] for tasks assessed/performed      Past Medical History:  Diagnosis Date  . Angioedema   . Endometriosis   . Hypertension   . Premature ovarian failure 2003    Past Surgical History:  Procedure Laterality Date  . CHOLECYSTECTOMY    . LAPAROSCOPY      There were no vitals filed for this visit.      Subjective Assessment - 11/26/16 1142    Subjective I have stopped doing the exercise classes because I could tell I was getting very tight.  I have been having more trouble with constipation.  No pain but my whole left low back and hip area feels tight.   Pertinent History Goes by Olegario Messier"; endometriosis, cholecystectomy   Patient Stated Goals get rid of the pain   Currently in Pain? No/denies                         [facility] Adult PT Treatment/Exercise - 11/26/16 0001      Lumbar Exercises: Sidelying   Clam 10 reps     Lumbar Exercises: Prone   Straight Leg Raise 10 reps     Manual Therapy   Manual Therapy Soft tissue mobilization   Soft tissue mobilization bilateal (left > right) lumbar, thoracic paraspinals, glutes, QL, psoas,                 PT Education - 11/26/16 1139    Education provided Yes   Education Details clam and prone hip extension   Person(s) Educated Patient   Methods Explanation;Demonstration;Tactile cues;Verbal cues;Handout   Comprehension  Verbalized understanding;Returned demonstration          PT Short Term Goals - 11/12/16 1145      PT SHORT TERM GOAL #1   Title Pt will be independent with initial HEP   Time 4   Period Weeks   Status On-going     PT SHORT TERM GOAL #2   Title Pt will be able to contract and bulge pelvic floor due to increased muscle length and ability to relax pelvic floor   Time 4   Period Weeks   Status On-going     PT SHORT TERM GOAL #3   Title Pt will be able to peform self massage to pelvic floor muscle with 50% decreased pain   Time 4   Period Weeks   Status On-going           PT Long Term Goals - 11/12/16 1146      PT LONG TERM GOAL #1   Title FOTO < or = to 10% limitation   Time 12   Period Weeks   Status On-going     PT LONG TERM GOAL #2   Title independent with advanced HEP for return to execise routine without increased muscle spasms   Time 12  Period Weeks   Status On-going     PT LONG TERM GOAL #3   Title Marinoff scale 1/3 for decreased pain with intercourse   Time 12   Period Weeks   Status On-going     PT LONG TERM GOAL #4   Title Pt will be able to perform 2/5 pelvic floor contraction and hold it for at least 10 seconds for improved bladder control   Time 12   Period Weeks   Status On-going     PT LONG TERM GOAL #5   Title Pt will report at least 70% reduction in pain overall for improved quality of life   Time 12   Period Weeks   Status On-going               Plan - 11/26/16 1147    Clinical Impression Statement Pt has still had difficulty with relaxation.  She reports a lot of stress in her life at this time so focus on soft tissue relaxation and lengthening and education on altering exercise routine was provided.  Pt was given some gentle core and hip strengthening and pt found those exercises to be challenging.  Skilled PT needed to improve soft tissue length and activation for functional activities.   Clinical Impairments Affecting  Rehab Potential history of abdominal surgeries 2x; chronic pain   PT Treatment/Interventions ADLs/Self Care Home Management;Biofeedback;Cryotherapy;Electrical Stimulation;Iontophoresis 4mg /ml Dexamethasone;Moist Heat;Traction;Ultrasound;Gait training;Stair training;Functional mobility training;Therapeutic activities;Therapeutic exercise;Balance training;Neuromuscular re-education;Patient/family education;Manual techniques;Passive range of motion;Dry needling;Taping   PT Next Visit Plan bulging pelvic floor, core strength and stability exercises, manual as needed   Consulted and Agree with Plan of Care Patient      Patient will benefit from skilled therapeutic intervention in order to improve the following deficits and impairments:  Pain, Impaired tone, Decreased strength, Increased muscle spasms  Visit Diagnosis: Other muscle spasm  Muscle weakness (generalized)  Other lack of coordination     Problem List Patient Active Problem List   Diagnosis Date Noted  . Premature ovarian failure 08/01/2016  . Osteopenia 08/01/2016  . Atrophic vaginitis 08/01/2016  . Idiopathic angioedema 08/01/2016    Vincente PoliJakki Crosser, PT 11/26/2016, 11:50 AM  [facility] [address] [place], [place], [address] Phone: [phone]   Fax:  [phone]  Name: Eber JonesKathleen Troop MRN: [id number] Date of Birth: 06/10/1966

## 2016-11-26 NOTE — Patient Instructions (Signed)
(  Home) Extension: Hip    With support under abdomen, tighten stomach. Lift right leg in line with body. Do not hyperextend. Alternate legs. Repeat __10__ times per set. Do __2__ sets per session. Do __5__ sessions per week.  Copyright  VHI. All rights reserved.  Clam    Lie on side, legs bent 90. Open top knee to ceiling, rotating leg outward. Touch toes to ankle of bottom leg. Close knees, rotating leg inward. Maintain hip position. Repeat ___10_ times. Repeat on other side. Do _1___ sessions per day.   [email or web address].exer.[email or web address]   Copyright  VHI. All rights reserved.

## 2016-11-29 ENCOUNTER — Ambulatory Visit: Payer: Managed Care, Other (non HMO) | Admitting: Physical Therapy

## 2016-11-29 DIAGNOSIS — R278 Other lack of coordination: Secondary | ICD-10-CM

## 2016-11-29 DIAGNOSIS — M62838 Other muscle spasm: Secondary | ICD-10-CM

## 2016-11-29 DIAGNOSIS — M6281 Muscle weakness (generalized): Secondary | ICD-10-CM

## 2016-11-29 NOTE — Patient Instructions (Addendum)
Hip serier 4 ways:         Hip series 4 ways - 20 reps each side    Keeping spine neutral and brace abdominals as you lift the arm straight ahead.  Alternate arms and do 20 reps  Trigger Point Dry Needling  . What is Trigger Point Dry Needling (DN)? o DN is a physical therapy technique used to treat muscle pain and dysfunction. Specifically, DN helps deactivate muscle trigger points (muscle knots).  o A thin filiform needle is used to penetrate the skin and stimulate the underlying trigger point. The goal is for a local twitch response (LTR) to occur and for the trigger point to relax. No medication of any kind is injected during the procedure.   . What Does Trigger Point Dry Needling Feel Like?  o The procedure feels different for each individual patient. Some patients report that they do not actually feel the needle enter the skin and overall the process is not painful. Very mild bleeding may occur. However, many patients feel a deep cramping in the muscle in which the needle was inserted. This is the local twitch response.   Marland Kitchen. How Will I feel after the treatment? o Soreness is normal, and the onset of soreness may not occur for a few hours. Typically this soreness does not last longer than two days.  o Bruising is uncommon, however; ice can be used to decrease any possible bruising.  o In rare cases feeling tired or nauseous after the treatment is normal. In addition, your symptoms may get worse before they get better, this period will typically not last longer than 24 hours.   . What Can I do After My Treatment? o Increase your hydration by drinking more water for the next 24 hours. o You may place ice or heat on the areas treated that have become sore, however, do not use heat on inflamed or bruised areas. Heat often brings more relief post needling. o You can continue your regular activities, but vigorous activity is not recommended initially after the treatment for 24  hours. o DN is best combined with other physical therapy such as strengthening, stretching, and other therapies.    [facility] [address], [address] [place], [place] [address] Phone # [phone] Fax [phone]

## 2016-11-29 NOTE — Therapy (Signed)
[facility] [address] [place], [place], [address] Phone: [phone]   Fax:  [phone]  Physical Therapy Treatment  Patient Details  Name: Carolyn Ewing MRN: [id number] Date of Birth: 08/16/1965 Referring Provider: Elsie LincolnKelly Leggett  Encounter Date: 11/29/2016      PT End of Session - 11/29/16 1141    Visit Number 6   Number of Visits 20   Date for PT Re-Evaluation 02/25/17   PT Start Time 1101   PT Stop Time 1140   PT Time Calculation (min) 39 min   Activity Tolerance Patient tolerated treatment well   Behavior During Therapy [facility] for tasks assessed/performed      Past Medical History:  Diagnosis Date  . Angioedema   . Endometriosis   . Hypertension   . Premature ovarian failure 2003    Past Surgical History:  Procedure Laterality Date  . CHOLECYSTECTOMY    . LAPAROSCOPY      There were no vitals filed for this visit.      Subjective Assessment - 11/29/16 1145    Subjective Pt reports she is still having tightness and some pain in low back since that exercise class.  She has not attempted intercourse yet due to husband being out of town.   Pertinent History Goes by Carolyn Messier"Kathy"; endometriosis, cholecystectomy   Patient Stated Goals get rid of the pain   Currently in Pain? No/denies                         [facility] - 11/29/16 0001      Lumbar Exercises: Sidelying   Other Sidelying Lumbar Exercises hip series 4 ways on mat - 10 each way     Lumbar Exercises: Quadruped   Single Arm Raise Right;Left;20 reps;2 seconds  with brace   Plank quadruped with abdominal brace     Manual Therapy   Manual Therapy Soft tissue mobilization   Soft tissue mobilization bilateal (left > right) lumbar, thoracic paraspinals, glutes, QL, psoas,                 PT Education - 11/29/16 1138    Education provided Yes   Education Details dry needling info, hip series,  quadruped   Person(s) Educated Patient   Methods Explanation;Demonstration;Tactile cues;Verbal cues;Handout   Comprehension Verbalized understanding;Returned demonstration          PT Short Term Goals - 11/29/16 1144      PT SHORT TERM GOAL #1   Title Pt will be independent with initial HEP   Time 4   Period Weeks   Status Achieved     PT SHORT TERM GOAL #2   Title Pt will be able to contract and bulge pelvic floor due to increased muscle length and ability to relax pelvic floor   Time 4   Period Weeks   Status On-going     PT SHORT TERM GOAL #3   Title Pt will be able to peform self massage to pelvic floor muscle with 50% decreased pain   Time 4   Period Weeks   Status Achieved           PT Long Term Goals - 11/12/16 1146      PT LONG TERM GOAL #1   Title FOTO < or = to 10% limitation   Time 12   Period Weeks   Status On-going     PT LONG TERM GOAL #2  Title independent with advanced HEP for return to execise routine without increased muscle spasms   Time 12   Period Weeks   Status On-going     PT LONG TERM GOAL #3   Title Marinoff scale 1/3 for decreased pain with intercourse   Time 12   Period Weeks   Status On-going     PT LONG TERM GOAL #4   Title Pt will be able to perform 2/5 pelvic floor contraction and hold it for at least 10 seconds for improved bladder control   Time 12   Period Weeks   Status On-going     PT LONG TERM GOAL #5   Title Pt will report at least 70% reduction in pain overall for improved quality of life   Time 12   Period Weeks   Status On-going               Plan - 11/29/16 1141    Clinical Impression Statement Patient had muscle spasms in low back and left hip.  She was educated on gentle strengthening exercises to add to HEP for improved core stability during walking and functional activities.  Pt was educated on dry needling for trigger point release as it will most likely benefit patient due to persistent  trigger points.   Clinical Impairments Affecting Rehab Potential history of abdominal surgeries 2x; chronic pain   PT Treatment/Interventions ADLs/Self Care Home Management;Biofeedback;Cryotherapy;Electrical Stimulation;Iontophoresis 4mg /ml Dexamethasone;Moist Heat;Traction;Ultrasound;Gait training;Stair training;Functional mobility training;Therapeutic activities;Therapeutic exercise;Balance training;Neuromuscular re-education;Patient/family education;Manual techniques;Passive range of motion;Dry needling;Taping   PT Next Visit Plan dry needling as needed, bulging pelvic floor, core strength and stability exercises   Consulted and Agree with Plan of Care Patient      Patient will benefit from skilled therapeutic intervention in order to improve the following deficits and impairments:  Pain, Impaired tone, Decreased strength, Increased muscle spasms  Visit Diagnosis: Other muscle spasm  Muscle weakness (generalized)  Other lack of coordination     Problem List Patient Active Problem List   Diagnosis Date Noted  . Premature ovarian failure 08/01/2016  . Osteopenia 08/01/2016  . Atrophic vaginitis 08/01/2016  . 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 [place], [place], [address] Phone: [phone]   Fax:  [phone]  Name: Carolyn Ewing MRN: [id number] Date of Birth: 10/25/1965

## 2016-12-03 ENCOUNTER — Ambulatory Visit: Payer: Managed Care, Other (non HMO) | Admitting: Physical Therapy

## 2016-12-03 DIAGNOSIS — R278 Other lack of coordination: Secondary | ICD-10-CM

## 2016-12-03 DIAGNOSIS — M62838 Other muscle spasm: Secondary | ICD-10-CM | POA: Diagnosis not present

## 2016-12-03 DIAGNOSIS — M6281 Muscle weakness (generalized): Secondary | ICD-10-CM

## 2016-12-03 NOTE — Therapy (Signed)
[facility] [address], [address] [place], [place], [address] Phone: [phone]   Fax:  [phone]  Physical Therapy Treatment  Patient Details  Name: Carolyn JonesKathleen Ewing MRN: [id number] Date of Birth: [birth year] Referring Provider: Elsie LincolnKelly Leggett  Encounter Date: 12/03/2016      PT End of Session - 12/03/16 1206    Visit Number 7  dry needling   Number of Visits 20   Date for PT Re-Evaluation 02/25/17   PT Start Time 1101   PT Stop Time 1141   PT Time Calculation (min) 40 min   Activity Tolerance Patient tolerated treatment well   Behavior During Therapy [facility] for [facility] assessed/performed      Past Medical History:  Diagnosis Date  . Angioedema   . Endometriosis   . Hypertension   . Premature ovarian failure 2003    Past Surgical History:  Procedure Laterality Date  . CHOLECYSTECTOMY    . LAPAROSCOPY      There were no vitals filed for this visit.      Subjective Assessment - 12/03/16 1238    Subjective Pt was given information of dry needling and reports she still wants to try it.  She reports she has been feeling tight, but no pain in low back.  Has not attempted intercourse at this time.   Patient Stated Goals get rid of the pain   Currently in Pain? No/denies                         [facility]/Exercise - 12/03/16 0001      Manual Therapy   Manual Therapy Soft tissue mobilization   Soft tissue mobilization bilateal (left > right) lumbar, thoracic paraspinals, glutes, QL, psoas,           Trigger Point Dry Needling - 12/03/16 1235    Consent Given? Yes   Education Handout Provided Yes   Muscles Treated Upper Body Quadratus Lumborum;Iliopsoas  left lumbar multifidi   Muscles Treated Lower Body Adductor longus/brevius/maximus   Adductor Response Twitch response elicited;Palpable increased muscle length  all muscles increased muscle length                PT  Short Term Goals - 11/29/16 1144      PT SHORT TERM GOAL #1   Title Pt will be independent with initial HEP   Time 4   Period Weeks   Status Achieved     PT SHORT TERM GOAL #2   Title Pt will be able to contract and bulge pelvic floor due to increased muscle length and ability to relax pelvic floor   Time 4   Period Weeks   Status On-going     PT SHORT TERM GOAL #3   Title Pt will be able to peform self massage to pelvic floor muscle with 50% decreased pain   Time 4   Period Weeks   Status Achieved           PT Long Term Goals - 12/03/16 1209      PT LONG TERM GOAL #1   Title FOTO < or = to 10% limitation   Time 12   Period Weeks   Status On-going     PT LONG TERM GOAL #2   Title independent with advanced HEP for return to execise routine without increased muscle spasms   Time 12   Period Weeks   Status On-going  PT LONG TERM GOAL #5   Title Pt will report at least 70% reduction in pain overall for improved quality of life   Time 12   Period Weeks   Status On-going               Plan - 12/03/16 1230    Clinical Impression Statement Pt responded well to dry needling to QL, lumbar multifidi, adductors and Psoas.  Pt received manual for increased tissue length after needling treatment.  Pt continues to need skilled PT in order to improved muscle function and tissue length in order to perform functional activities without increased pain.   Clinical Impairments Affecting Rehab Potential history of abdominal surgeries 2x; chronic pain   PT Treatment/Interventions ADLs/Self Care Home Management;Biofeedback;Cryotherapy;Electrical Stimulation;Iontophoresis 4mg /ml Dexamethasone;Moist Heat;Traction;Ultrasound;Gait training;Stair training;Functional mobility training;Therapeutic activities;Therapeutic exercise;Balance training;Neuromuscular re-education;Patient/family education;Manual techniques;Passive range of motion;Dry needling;Taping   PT Next Visit Plan bulging  exercises, dry needling and manual as needed, core exercises to progress HEP   Consulted and Agree with Plan of Care Patient      Patient will benefit from skilled therapeutic intervention in order to improve the following deficits and impairments:  Pain, Impaired tone, Decreased strength, Increased muscle spasms  Visit Diagnosis: Other muscle spasm  Muscle weakness (generalized)  Other lack of coordination     Problem List Patient Active Problem List   Diagnosis Date Noted  . Premature ovarian failure 08/01/2016  . Osteopenia 08/01/2016  . Atrophic vaginitis 08/01/2016  . 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, [address] [place], [place], [address] Phone: [phone]   Fax:  [phone]  Name: Carolyn Ewing MRN: [id number] Date of Birth: 1966-04-25

## 2016-12-06 ENCOUNTER — Encounter: Payer: Managed Care, Other (non HMO) | Admitting: Physical Therapy

## 2016-12-10 ENCOUNTER — Encounter: Payer: Self-pay | Admitting: Physical Therapy

## 2016-12-10 ENCOUNTER — Ambulatory Visit: Payer: Managed Care, Other (non HMO) | Admitting: Physical Therapy

## 2016-12-10 DIAGNOSIS — R278 Other lack of coordination: Secondary | ICD-10-CM

## 2016-12-10 DIAGNOSIS — M62838 Other muscle spasm: Secondary | ICD-10-CM | POA: Diagnosis not present

## 2016-12-10 DIAGNOSIS — M6281 Muscle weakness (generalized): Secondary | ICD-10-CM

## 2016-12-10 NOTE — Therapy (Signed)
[facility] [address] [place], [place], [address] Phone: [phone]   Fax:  [phone]  Physical Therapy Treatment  Patient Details  Name: Carolyn Ewing MRN: [id number] Date of Birth: 1966/02/07 Referring Provider: Elsie Ewing  Encounter Date: 12/10/2016      PT End of Session - 12/10/16 1312    Visit Number 8  dry needling, arived 11 min late   Number of Visits 20   Date for PT Re-Evaluation 02/25/17   PT Start Time 1111   PT Stop Time 1144   PT Time Calculation (min) 33 min   Activity Tolerance Patient tolerated treatment well   Behavior During Therapy [facility] for tasks assessed/performed      Past Medical History:  Diagnosis Date  . Angioedema   . Endometriosis   . Hypertension   . Premature ovarian failure 2003    Past Surgical History:  Procedure Laterality Date  . CHOLECYSTECTOMY    . LAPAROSCOPY      There were no vitals filed for this visit.      Subjective Assessment - 12/10/16 1116    Subjective Pt is having discomfort and tightness in low back and around tailbone but felt like the dry needling helped   Pertinent History Goes by "Carolyn Ewing"; endometriosis, cholecystectomy   Patient Stated Goals get rid of the pain   Currently in Pain? No/denies                         [facility] Adult PT Treatment/Exercise - 12/10/16 0001      Lumbar Exercises: Quadruped   [place] 10 reps   Single Arm Raise Right;Left;20 reps;2 seconds  with brace   Plank quadruped with abdominal brace     Manual Therapy   Manual Therapy Soft tissue mobilization   Soft tissue mobilization bilateal (left > right) lumbar, thoracic paraspinals, glutes, QL, psoas,           Trigger Point Dry Needling - 12/10/16 1310    Consent Given? Yes   Education Handout Provided No   Muscles Treated Upper Body Gluteus minimus;Quadratus Lumborum;Gluteus maximus   Muscles Treated Lower Body Gluteus  minimus;Gluteus maximus  lumbar multifidi left   Gluteus Maximus Response Twitch response elicited;Palpable increased muscle length   Gluteus Minimus Response Twitch response elicited;Palpable increased muscle length   Adductor Response Twitch response elicited;Palpable increased muscle length                PT Short Term Goals - 12/10/16 1317      PT SHORT TERM GOAL #2   Title Pt will be able to contract and bulge pelvic floor due to increased muscle length and ability to relax pelvic floor   Time 4   Period Weeks   Status Achieved           PT Long Term Goals - 12/03/16 1209      PT LONG TERM GOAL #1   Title FOTO < or = to 10% limitation   Time 12   Period Weeks   Status On-going     PT LONG TERM GOAL #2   Title independent with advanced HEP for return to execise routine without increased muscle spasms   Time 12   Period Weeks   Status On-going     PT LONG TERM GOAL #5   Title Pt will report at least 70% reduction in pain overall for improved quality of life   Time  12   Period Weeks   Status On-going               Plan - 12/10/16 1119    Clinical Impression Statement Patient reports reduced muscle spasms after needling today.  Pt able to  perform pelvic floor bulging exercises and was added to stretches as part of HEP   Rehab Potential Excellent   Clinical Impairments Affecting Rehab Potential history of abdominal surgeries 2x; chronic pain   PT Treatment/Interventions ADLs/Self Care Home Management;Biofeedback;Cryotherapy;Electrical Stimulation;Iontophoresis 4mg /ml Dexamethasone;Moist Heat;Traction;Ultrasound;Gait training;Stair training;Functional mobility training;Therapeutic activities;Therapeutic exercise;Balance training;Neuromuscular re-education;Patient/family education;Manual techniques;Passive range of motion;Dry needling;Taping   PT Next Visit Plan review bulging exercises, assess response to dry needling #2, and manual as needed, core  exercises to progress HEP   Consulted and Agree with Plan of Care Patient      Patient will benefit from skilled therapeutic intervention in order to improve the following deficits and impairments:  Pain, Impaired tone, Decreased strength, Increased muscle spasms  Visit Diagnosis: Other muscle spasm  Muscle weakness (generalized)  Other lack of coordination     Problem List Patient Active Problem List   Diagnosis Date Noted  . Premature ovarian failure 08/01/2016  . Osteopenia 08/01/2016  . Atrophic vaginitis 08/01/2016  . 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 [place], [place], [address] Phone: [phone]   Fax:  [phone]  Name: Carolyn Ewing MRN: [id number] Date of Birth: [birth year]

## 2016-12-13 ENCOUNTER — Encounter: Payer: Managed Care, Other (non HMO) | Admitting: Physical Therapy

## 2016-12-17 ENCOUNTER — Encounter: Payer: Managed Care, Other (non HMO) | Admitting: Physical Therapy

## 2016-12-18 ENCOUNTER — Encounter: Payer: Self-pay | Admitting: Physical Therapy

## 2016-12-18 ENCOUNTER — Ambulatory Visit: Payer: Managed Care, Other (non HMO) | Admitting: Physical Therapy

## 2016-12-18 DIAGNOSIS — M6281 Muscle weakness (generalized): Secondary | ICD-10-CM

## 2016-12-18 DIAGNOSIS — M62838 Other muscle spasm: Secondary | ICD-10-CM | POA: Diagnosis not present

## 2016-12-18 DIAGNOSIS — R278 Other lack of coordination: Secondary | ICD-10-CM

## 2016-12-18 NOTE — Therapy (Signed)
[facility] [address] [place], [place], [address] Phone: [phone]   Fax:  [phone]  Physical Therapy Treatment  Patient Details  Name: Carolyn JonesKathleen Ewing MRN: [id number] Date of Birth: May 02, 1966 Referring Provider: Elsie LincolnKelly Leggett  Encounter Date: 12/18/2016      PT End of Session - 12/18/16 0847    Visit Number 9   Number of Visits 20   Date for PT Re-Evaluation 02/25/17   PT Start Time 0845   PT Stop Time 0925   PT Time Calculation (min) 40 min   Activity Tolerance Patient tolerated treatment well   Behavior During Therapy [facility] for [facility] assessed/performed      Past Medical History:  Diagnosis Date  . Angioedema   . Endometriosis   . Hypertension   . Premature ovarian failure 2003    Past Surgical History:  Procedure Laterality Date  . CHOLECYSTECTOMY    . LAPAROSCOPY      There were no vitals filed for this visit.                       [facility] Adult PT Treatment/Exercise - 12/18/16 0001      Neuro Re-ed    Neuro Re-ed Details  rolling on ball for tactile feedback with bulging and relaxing     Manual Therapy   Soft tissue mobilization left glutes, bilat lumbar paraspinals and QL   Internal Pelvic Floor obudurator internus , ischiocavernosis          Trigger Point Dry Needling - 12/18/16 0950    Consent Given? Yes   Gluteus Maximus Response Twitch response elicited;Palpable increased muscle length   Gluteus Minimus Response Twitch response elicited;Palpable increased muscle length   Adductor Response Twitch response elicited;Palpable increased muscle length  bilat lumbar multifidi              PT Education - 12/18/16 0924    Education provided Yes   Education Details outside resources on internet and books since patient will be moving soon   Person(s) Educated Patient   Methods Explanation;Handout   Comprehension Verbalized understanding          PT  Short Term Goals - 12/10/16 1317      PT SHORT TERM GOAL #2   Title Pt will be able to contract and bulge pelvic floor due to increased muscle length and ability to relax pelvic floor   Time 4   Period Weeks   Status Achieved           PT Long Term Goals - 12/18/16 1012      PT LONG TERM GOAL #1   Title FOTO < or = to 10% limitation   Status On-going     PT LONG TERM GOAL #2   Title independent with advanced HEP for return to execise routine without increased muscle spasms   Time 12   Period Weeks   Status On-going     PT LONG TERM GOAL #3   Title Marinoff scale 1/3 for decreased pain with intercourse   Time 12   Period Weeks   Status On-going               Plan - 12/18/16 [id number]    Clinical Impression Statement Patient continues to have increased muscle length and report feeling better with dry needling.  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  Continues to need skilled PT for improved muscle  coordination and abilty to relax pelvic floor   Rehab Potential Excellent   PT Treatment/Interventions ADLs/Self Care Home Management;Biofeedback;Cryotherapy;Electrical Stimulation;Iontophoresis 4mg /ml Dexamethasone;Moist Heat;Traction;Ultrasound;Gait training;Stair training;Functional mobility training;Therapeutic activities;Therapeutic exercise;Balance training;Neuromuscular re-education;Patient/family education;Manual techniques;Passive range of motion;Dry needling;Taping   PT Next Visit Plan response to dry needling #3, manual, review stretches and    Consulted and Agree with Plan of Care Patient      Patient will benefit from skilled therapeutic intervention in order to improve the following deficits and impairments:  Pain, Impaired tone, Decreased strength, Increased muscle spasms  Visit Diagnosis: Other muscle spasm  Muscle weakness (generalized)  Other lack of coordination     Problem List Patient Active Problem List    Diagnosis Date Noted  . Premature ovarian failure 08/01/2016  . Osteopenia 08/01/2016  . Atrophic vaginitis 08/01/2016  . 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 [address] [place], [place], [address] Phone: [phone]   Fax:  [phone]  Name: Carolyn Ewing MRN: [id number] Date of Birth: July 05, 1965

## 2016-12-18 NOTE — Patient Instructions (Signed)
Internet resources:  Pelvic floor based information: Femme fusion fitness - YouTube Shelly Prosko - YouTube [email or web address] (tools like dilators and blog articles) [email or web address] (tools like dilators and blog articles)   Books:  Heal Pelvic Pain: The Proven Stretching, Strengthening, and Nutrition Program for Relieving Pain, Incontinence, I.B.S, and Other Symptoms Without Surgery by Pasty SpillersAmy Stein  Pelvic Pain Explained: What you need to know by Judeth CornfieldStephanie A. Cardell PeachPrendergast, Elizabeth H. Akincilar  Sex without pain: A self-treatment guide to the sex life you deserve by Saverio DankerHeather Jeffcoat , DPT A Headache in the Pelvis, a New, Revised, Expanded and Updated 6th Edition: A New Understanding and Treatment for Chronic Pelvic Pain Syndromes by Rosalio Loudavid Wise, Valli Glanceodney Anderson  Pain relief tools:  At home TENS units - Amazon Dilators - [employer] Corporation[employer]zon, CMT, or Vaginismus websites

## 2016-12-20 ENCOUNTER — Encounter: Payer: Self-pay | Admitting: Physical Therapy

## 2016-12-20 ENCOUNTER — Ambulatory Visit: Payer: Managed Care, Other (non HMO) | Admitting: Physical Therapy

## 2016-12-20 DIAGNOSIS — M62838 Other muscle spasm: Secondary | ICD-10-CM | POA: Diagnosis not present

## 2016-12-20 DIAGNOSIS — M6281 Muscle weakness (generalized): Secondary | ICD-10-CM

## 2016-12-20 NOTE — Therapy (Signed)
[facility] [address], [address] [place], [place], [address] Phone: [phone]   Fax:  [phone]  Physical Therapy Treatment  Patient Details  Name: Carolyn JonesKathleen Ewing MRN: [id number] Date of Birth: May 07, 1966 Referring Provider: Elsie LincolnKelly Leggett  Encounter Date: 12/20/2016      PT End of Session - 12/20/16 1100    Visit Number 10   Number of Visits 20   Date for PT Re-Evaluation 02/25/17   PT Start Time 1100   PT Stop Time 1140   PT Time Calculation (min) 40 min   Activity Tolerance Patient tolerated treatment well   Behavior [facility] Therapy [facility] for tasks assessed/performed      Past Medical History:  Diagnosis Date  . Angioedema   . Endometriosis   . Hypertension   . Premature ovarian failure 2003    Past Surgical History:  Procedure Laterality Date  . CHOLECYSTECTOMY    . LAPAROSCOPY      There were no vitals filed for this visit.      Subjective Assessment - 12/20/16 1151    Subjective Pt reports her back tightened up a little when she almost fell on some wet concrete at the car dealership yesterday.  States the needling helped.   Pertinent History Goes by Carolyn Ewing"Carolyn Ewing"; endometriosis, cholecystectomy   Patient Stated Goals get rid of the pain   Currently in Pain? No/denies                         [facility] - 12/20/16 0001      Lumbar Exercises: Supine   Other Supine Lumbar Exercises lying on foam roller marching, knee abduction, UE horizontal abduction yellow, UE alt - 20xeach   Other Supine Lumbar Exercises foam roller under glutes with hip rotations - 30x each side     Manual Therapy   Soft tissue mobilization left glutes, bilat lumbar paraspinals and QL   Myofascial Release bilateral lumbar paraspinals                  PT Short Term Goals - 12/10/16 1317      PT SHORT TERM GOAL #2   Title Pt will be able to contract and bulge pelvic floor due to  increased muscle length and ability to relax pelvic floor   Time 4   Period Weeks   Status Achieved           PT Long Term Goals - 12/18/16 1012      PT LONG TERM GOAL #1   Title FOTO < or = to 10% limitation   Status On-going     PT LONG TERM GOAL #2   Title independent with advanced HEP for return to execise routine without increased muscle spasms   Time 12   Period Weeks   Status On-going     PT LONG TERM GOAL #3   Title Marinoff scale 1/3 for decreased pain with intercourse   Time 12   Period Weeks   Status On-going               Plan - 12/20/16 1129    Clinical Impression Statement Patient responded well to dry needling from last session.  Pt still has tightness in lumbar paraspinals and felt QL on left refer to the groin.  Pt did well with abdominal strengthening exercises with cues for abdominal contranction.  Continues to benefit from skilled PT for improved ability to perform functional activities  Rehab Potential Excellent   Clinical Impairments Affecting Rehab Potential history of abdominal surgeries 2x; chronic pain   PT Treatment/Interventions ADLs/Self Care Home Management;Biofeedback;Cryotherapy;Electrical Stimulation;Iontophoresis 4mg /ml Dexamethasone;Moist Heat;Traction;Ultrasound;Gait training;Stair training;Functional mobility training;Therapeutic activities;Therapeutic exercise;Balance training;Neuromuscular re-education;Patient/family education;Manual techniques;Passive range of motion;Dry needling;Taping   PT Next Visit Plan dry needling #4, abdominal strengthening   Consulted and Agree with Plan of Care Patient      Patient will benefit from skilled therapeutic intervention in order to improve the following deficits and impairments:  Pain, Impaired tone, Decreased strength, Increased muscle spasms  Visit Diagnosis: Other muscle spasm  Muscle weakness (generalized)     Problem List Patient Active Problem List   Diagnosis Date Noted   . Premature ovarian failure 08/01/2016  . Osteopenia 08/01/2016  . Atrophic vaginitis 08/01/2016  . 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 [place], [place], [address] Phone: [phone]   Fax:  [phone]  Name: Carolyn Ewing MRN: [id number] Date of Birth: [birth year]

## 2016-12-25 ENCOUNTER — Ambulatory Visit: Payer: Managed Care, Other (non HMO) | Attending: Obstetrics & Gynecology | Admitting: Physical Therapy

## 2016-12-25 DIAGNOSIS — M62838 Other muscle spasm: Secondary | ICD-10-CM

## 2016-12-25 DIAGNOSIS — R278 Other lack of coordination: Secondary | ICD-10-CM | POA: Insufficient documentation

## 2016-12-25 DIAGNOSIS — M6281 Muscle weakness (generalized): Secondary | ICD-10-CM

## 2016-12-25 NOTE — Therapy (Signed)
[facility] [address] [place], [place], [address] Phone: [phone]   Fax:  [phone]  Physical Therapy Treatment  Patient Details  Name: Carolyn Ewing MRN: [id number] Date of Birth: Apr 30, 1966 Referring Provider: Silas Ewing  Encounter Date: 12/25/2016      PT End of Session - 12/25/16 1435    Visit Number 11   Number of Visits 20   Date for PT Re-Evaluation 02/25/17   PT Start Time 0300   PT Stop Time 1442   PT Time Calculation (min) 39 min   Activity Tolerance Patient tolerated treatment well   Behavior During Therapy [facility] for tasks assessed/performed      Past Medical History:  Diagnosis Date  . Angioedema   . Endometriosis   . Hypertension   . Premature ovarian failure 2003    Past Surgical History:  Procedure Laterality Date  . CHOLECYSTECTOMY    . LAPAROSCOPY      There were no vitals filed for this visit.      Subjective Assessment - 12/25/16 1448    Subjective I have been feeling good.  I have no issues or concerns, but I haven't had time to attempt intercourse due to all the stuff going on with moving.   Pertinent History Goes by Carolyn Ewing"; endometriosis, cholecystectomy   Patient Stated Goals get rid of the pain   Currently in Pain? No/denies                         [facility] Adult PT Treatment/Exercise - 12/25/16 0001      Lumbar Exercises: Seated   LAQ on Ball Limitations seated on ball and rolling circles both ways     Manual Therapy   Soft tissue mobilization left glutes, bilat lumbar paraspinals and QL          Trigger Point Dry Needling - 12/25/16 1450    Consent Given? Yes   Muscles Treated Lower Body --  bilateral multifidi, twitch and mm lengthening                PT Short Term Goals - 12/10/16 1317      PT SHORT TERM GOAL #2   Title Pt will be able to contract and bulge pelvic floor due to increased muscle length and ability to relax  pelvic floor   Time 4   Period Weeks   Status Achieved           PT Long Term Goals - 12/25/16 1408      PT LONG TERM GOAL #1   Title FOTO < or = to 10% limitation   Baseline 4% limited   Time 12   Period Weeks   Status Achieved     PT LONG TERM GOAL #2   Title independent with advanced HEP for return to execise routine without increased muscle spasms   Time 12   Period Weeks   Status Achieved     PT LONG TERM GOAL #3   Title Marinoff scale 1/3 for decreased pain with intercourse   Baseline unknown   Time 12   Period Weeks   Status Not Met     PT LONG TERM GOAL #4   Title Pt will be able to perform 2/5 pelvic floor contraction and hold it for at least 10 seconds for improved bladder control   Time 12   Period Weeks   Status Achieved     PT LONG TERM  GOAL #5   Title Pt will report at least 70% reduction in pain overall for improved quality of life   Baseline I don't feel any pain doing things, at least 70%   Time 12   Period Weeks   Status Achieved               Plan - 12/25/16 1410    Clinical Impression Statement Patient is moving and has met most of her goals.  She continues to have some muscle tightness that she will work on with her HEP. She is able to discharge with HEP today.     Clinical Impairments Affecting Rehab Potential history of abdominal surgeries 2x; chronic pain   PT Treatment/Interventions ADLs/Self Care Home Management;Biofeedback;Cryotherapy;Electrical Stimulation;Iontophoresis 23m/ml Dexamethasone;Moist Heat;Traction;Ultrasound;Gait training;Stair training;Functional mobility training;Therapeutic activities;Therapeutic exercise;Balance training;Neuromuscular re-education;Patient/family education;Manual techniques;Passive range of motion;Dry needling;Taping   PT Next Visit Plan discharged today   Consulted and Agree with Plan of Care Patient      Patient will benefit from skilled therapeutic intervention in order to improve the  following deficits and impairments:  Pain, Impaired tone, Decreased strength, Increased muscle spasms  Visit Diagnosis: Other muscle spasm  Muscle weakness (generalized)  Other lack of coordination     Problem List Patient Active Problem List   Diagnosis Date Noted  . Premature ovarian failure 08/01/2016  . Osteopenia 08/01/2016  . Atrophic vaginitis 08/01/2016  . 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 [place] [address]: [phone]  Fax:  [phone] Name: KShalicia CragheadMRN: [id number] of Birth: [birth year] PHYSICAL THERAPY DISCHARGE SUMMARY  Visits from Start of Care: 11  Current functional level related to goals / functional outcomes: See above   Remaining deficits: See above   Education / Equipment: HEP  Plan: Patient agrees to discharge.  Patient goals were partially met. Patient is being discharged due to being pleased with the current functional level.  ?????   Pt also moving and cannot return at this time.           Carolyn Ewing PT 12/25/16 2:52 PM

## 2017-06-06 ENCOUNTER — Telehealth: Payer: Self-pay

## 2017-06-06 NOTE — Telephone Encounter (Signed)
Rcvd VM from Pt, she is requesting  [facility] LM for Pt to call me back with Dr's name, ph. And fax numbers.

## 2018-02-10 IMAGING — MR MR CERVICAL SPINE W/O CM
5 series · 48 of 48 positions shown · non-contrast
Comparison: None.

CLINICAL DATA: Polyneuropathy, chronic intermittent tingling

EXAM:
MRI CERVICAL SPINE WITHOUT CONTRAST
TECHNIQUE: Multiplanar, multisequence MR imaging of the cervical spine was
performed. No intravenous contrast was administered.

[Series 3: T2 · sagittal · 3.0mm · 0.82mm/px · 6 of 12 slices shown (1 of 3)]
[im 1/12]
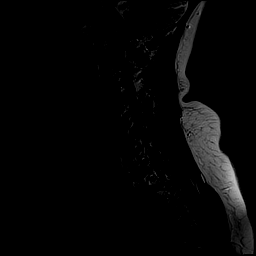
[im 3/12]
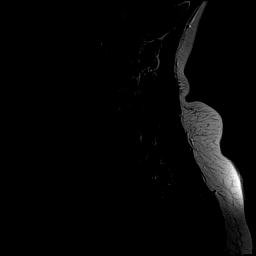
[im 5/12]
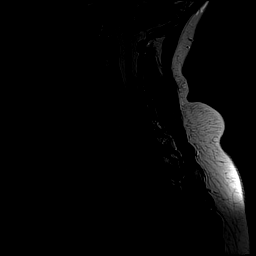
[im 7/12]
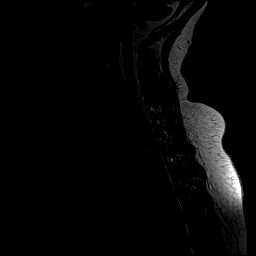
[im 9/12]
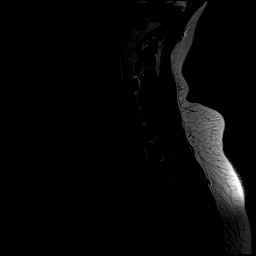
[im 12/12]
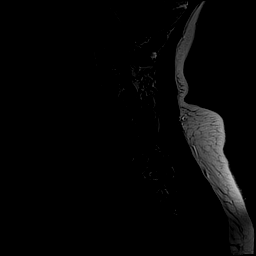

[Series 4: T1 · sagittal · 3.0mm · 0.82mm/px · 7 of 12 slices shown]
[im 1/12]
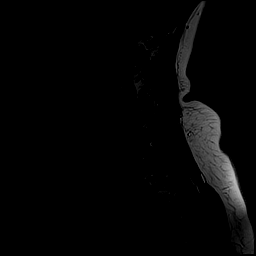
[im 2/12]
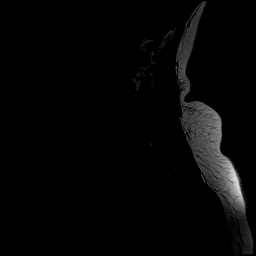
[im 4/12]
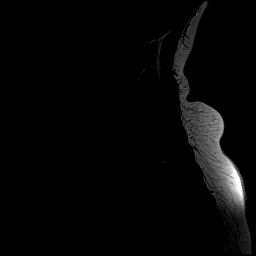
[im 6/12]
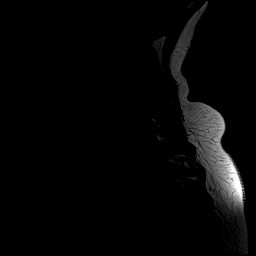
[im 8/12]
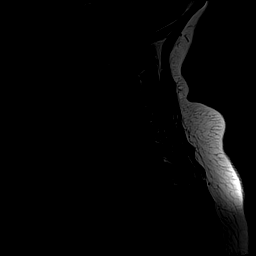
[im 10/12]
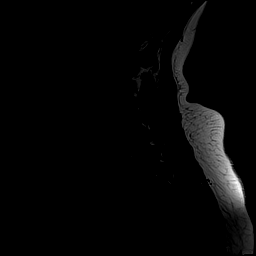
[im 12/12]
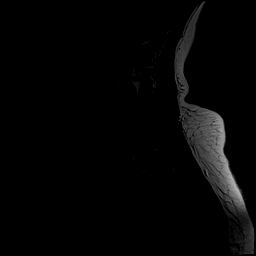

[Series 5: STIR · sagittal · 3.0mm · 0.82mm/px · 7 of 12 slices shown]
[im 1/12]
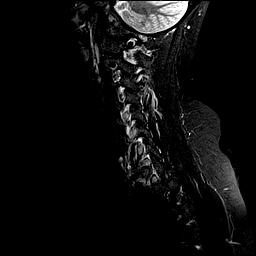
[im 2/12]
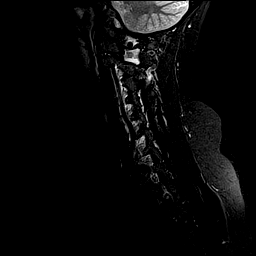
[im 4/12]
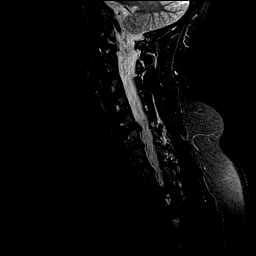
[im 6/12]
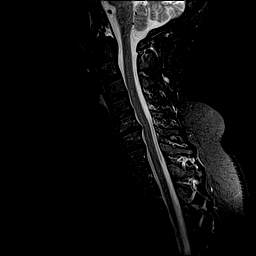
[im 8/12]
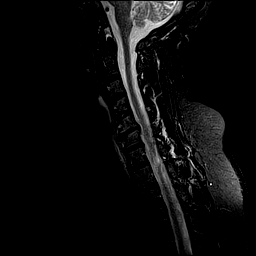
[im 10/12]
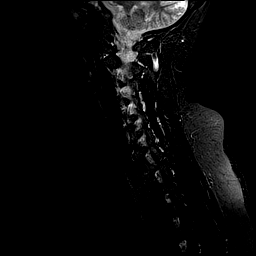
[im 12/12]
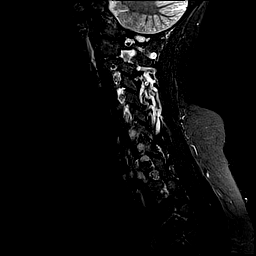

[Series 6: T2 · axial · 3.0mm · 0.74mm/px · z∈[-59,+34]mm · 14 of 26 slices shown (2 of 3)]
[im 1/26]
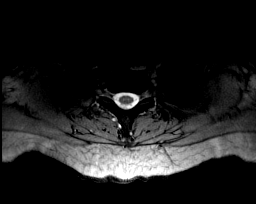
[im 2/26]
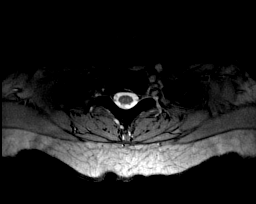
[im 4/26]
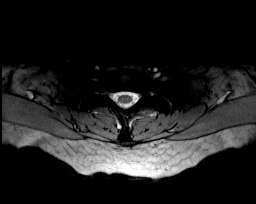
[im 6/26]
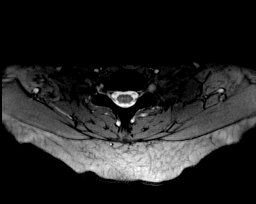
[im 8/26]
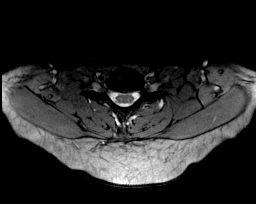
[im 10/26]
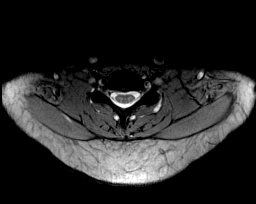
[im 12/26]
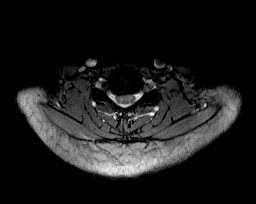
[im 14/26]
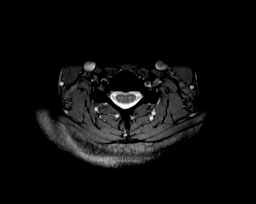
[im 16/26]
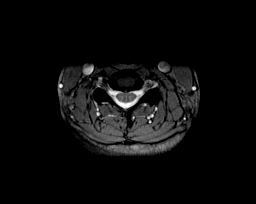
[im 18/26]
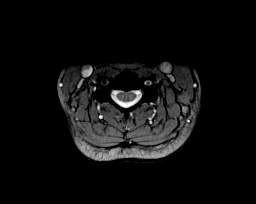
[im 20/26]
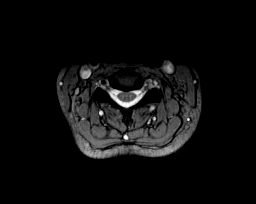
[im 22/26]
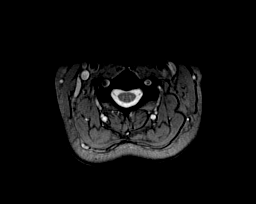
[im 24/26]
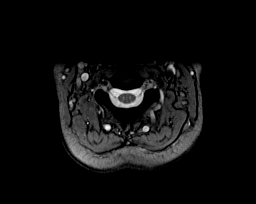
[im 26/26]
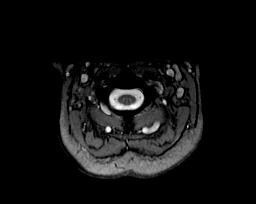

[Series 7: T2 · axial · 3.0mm · 0.74mm/px · z∈[-59,+34]mm · 14 of 26 slices shown (3 of 3)]
[im 1/26]
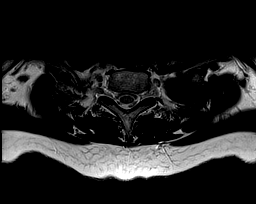
[im 2/26]
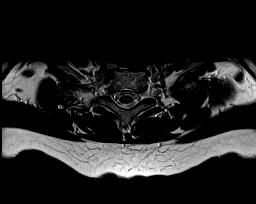
[im 4/26]
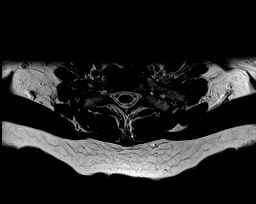
[im 6/26]
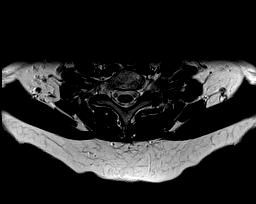
[im 8/26]
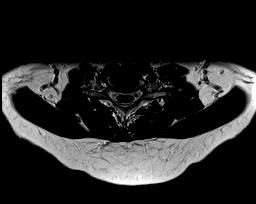
[im 10/26]
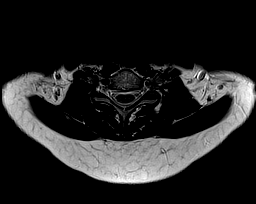
[im 12/26]
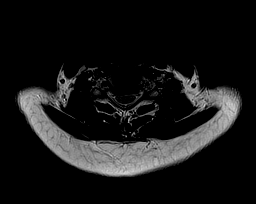
[im 14/26]
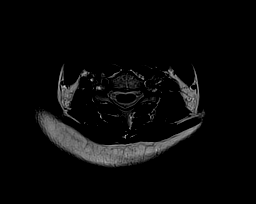
[im 16/26]
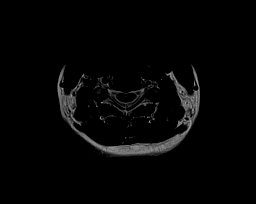
[im 18/26]
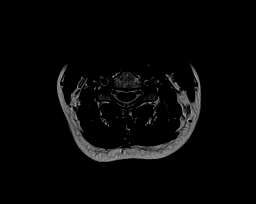
[im 20/26]
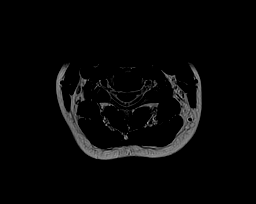
[im 22/26]
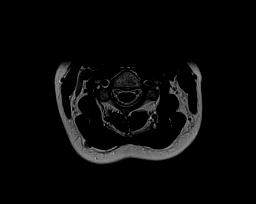
[im 24/26]
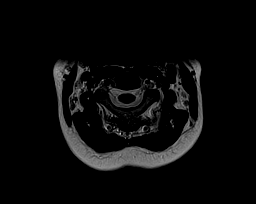
[im 26/26]
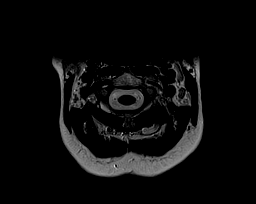

[48 of 48 positions shown; findings below may reference images not displayed]

FINDINGS: Alignment: Physiologic.

Vertebrae: No fracture, evidence of discitis, or bone lesion.

Cord: Normal signal and morphology.

Posterior Fossa, vertebral arteries, paraspinal tissues: Negative.

Disc levels:

Discs: Degenerative disc disease with disc height loss C5-6.

C2-3: No significant disc bulge. No neural foraminal stenosis. No
central canal stenosis.

C3-4: Tiny central disc protrusion. No neural foraminal stenosis. No
central canal stenosis.

C4-5: Tiny central disc protrusion. No neural foraminal stenosis. No
central canal stenosis.

C5-6: Broad right paracentral disc protrusion flattening the ventral
right paracentral cervical spinal cord. No neural foraminal
stenosis. No central canal stenosis.

C6-7: Shallow broad central disc protrusion. No neural foraminal
stenosis. No central canal stenosis.

C7-T1: No significant disc bulge. No neural foraminal stenosis. No
central canal stenosis.
IMPRESSION: 1. At C5-6 there is a broad right paracentral disc protrusion
flattening the ventral right paracentral cervical spinal cord.
2. Tiny central disc protrusions at C3-4 and C4-5.

## 2019-08-04 LAB — EXTERNAL UNMAPPED PROCEDURES: Hemoglobin A1C, External: 5.1 % (ref <5.7)

## 2019-12-22 ENCOUNTER — Encounter

## 2021-05-16 ENCOUNTER — Encounter: Attending: Orthopaedic Surgery | Primary: Family Medicine

## 2021-05-25 ENCOUNTER — Ambulatory Visit
Admit: 2021-05-25 | Discharge: 2021-05-25 | Payer: PRIVATE HEALTH INSURANCE | Attending: Orthopaedic Surgery | Primary: Family Medicine

## 2021-05-25 ENCOUNTER — Ambulatory Visit: Admit: 2021-05-25 | Discharge: 2021-05-25 | Payer: PRIVATE HEALTH INSURANCE | Primary: Family Medicine

## 2021-05-25 DIAGNOSIS — M25512 Pain in left shoulder: Secondary | ICD-10-CM

## 2021-05-25 NOTE — Progress Notes (Signed)
Name: Danielle Powell  Date of Birth: Nov 30, 1965  Gender: female  MRN: [id number]      CC: Shoulder Pain (Left shoulder pain, has gotten worse over the last year mainly at night )       HPI: Danielle Powell is a 55 y.o. female who presents with Shoulder Pain (Left shoulder pain, has gotten worse over the last year mainly at night )  .  She reports chronic left shoulder pain without injury.  Is been going on for several months with periods of worsening and improvement.  She has tried some home exercises otherwise has not had any significant treatment.  She has been diagnosed with a mixed connective tissue disorder and recently had a biopsy confirming lupus.  She is on Plaquenil.  She had chronic hormone replacement therapy which she is no longer on for the past 4 to 5 years and has been diagnosed with osteoporosis.  She reports pain in her left shoulder with overhead activity and at night.        ROS/Meds/PSH/PMH/FH/SH: I personally reviewed the patients standard intake form.  Below are the pertinents    Tobacco:  reports that she has never smoked. She has never used smokeless tobacco.  Diabetes: none  Other: ADD, anxiety and depression, Lupus, osteoporosis    Physical Examination:  General: no acute distress  Lungs: breathing easily  CV: regular rhythm by pulse  Left Shoulder: Tenderness palpation of the biceps tendon nontender over the [facility] joint.  Symmetric range of motion actively and passively.  Positive Hawkins and Neer impingement signs pain with speeds and O'Brien's.  5 out of 5 resisted rotator cuff strength and empty can internal and external rotation strength position without significant pain.  Motor and sensory intact distally.      Imaging:   Shoulder XR: Grashey, Axillary and Scapula Yviews     Clinical Indication:  1. Left shoulder pain, unspecified chronicity           Report: Stevenson Clinch, Axillary and Scapula Y XRs of the Left shoulder demonstrates no acute fracture dislocation or advanced degenerative  change    Impression: No acute findings as above        Also reviewed an MRI scan from [facility] on April 17, 2021 which demonstrates extensive tendinosis of the supraspinatus and a small focal full-thickness tear of the supraspinatus which may be transient tenderness anteriorly and is only about a centimeter.  There are some cystic changes of the greater tuberosity.  Partial-thickness tearing of the intra-articular portion of long head of the biceps as well as superior labrum tearing.  All imaging interpreted by myself Zuleica Seith T. Claudette Laws, MD independent of radiology review        Assessment:     ICD-10-CM    1. 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  She has good strength we discussed operative versus nonoperative management of this was discussed my concerns of rotator cuff repair given the fact she is on Plaquenil and has osteoporosis and has been on hormone replacement therapy in the past but is no longer.  I think is reasonable to pursue nonoperative management we discussed potentially subacromial injection which usually not that painful right now we will hold off on that.  We discussed formal physical therapy versus a home exercise program she elected to proceed with a home exercise program.  She was instructed on rotator cuff strengthening program and provided a Thera-Band today.  I will see her back in 6 to 8 weeks to evaluate her progress.              Lannette Avellino T. Claudette Laws MD, FAAOS  Orthopaedics and Sports Medicine

## 2021-07-06 ENCOUNTER — Encounter: Payer: PRIVATE HEALTH INSURANCE | Attending: Orthopaedic Surgery | Primary: Family Medicine

## 2021-12-27 NOTE — Progress Notes (Signed)
Formatting of this note might be different from the original.  Patient arrived ambulatory to treatment area for scheduled iron infusion. FC approval verified. PIV established with brisk blood return.    Feraheme administered as ordered - patient c/o flushing and SOB at 1412.  Iron finished infusing at 1401, flush only thing infusing at that time.  VSS, see flowsheet.  NS flush paused.  Patient's symptoms resolved after two minutes. Leanord Hawking, NP, notified.  NP ordered solumedrol 20 mg IV.  Medication administered as ordered.  Flush restarted.  Patient tolerated the remaining infusion well.      VS obtained before and 30 minutes after infusion; see flowsheets. Education provided throughout infusion. PIV removed.    Encouraged to call the office with any questions or concerns. Patient left ambulatory and in stable condition.     Electronically signed by Karma Greaser, RN at 12/27/2021  2:55 PM EDT

## 2022-02-07 ENCOUNTER — Encounter

## 2022-02-21 NOTE — Progress Notes (Signed)
Formatting of this note might be different from the original.  Pt at [facility] for 1st dose Prolia. Pt labs found to be WDL. Pt educated on Prolia and verbalized understanding. Pt tolerated injection well. Discharged ambulatory and aware of next appointment.   Electronically signed by Dennison Mascot, RN at 02/21/2022 11:42 AM EDT

## 2022-05-14 NOTE — Progress Notes (Signed)
Formatting of this note is different from the original.  Elements copied from my note dated 01/25/2022, have been reviewed and updated where appropriate, and all reflect current assessment and medical decision making from today's encounter, May 14, 2022    PATIENT: Danielle Powell   MRN: [id number]  DATE OF BIRTH: Sep 22, 1965     Cc: follow up of Iron Def Anemia    SUBJECTIVE  HPI:   Ms.  Mago  is a 56 y.o. year old female with CTD, PAD, fibromyalgia, HTN presents for follow of  IDA. Ms. Cadena reports that she has been doing better since the iron infusion. Notes intermittent bouts of fatigue and generalized joint pain related to change in season. Eating and drinking well. Husband having similar issues with IDA related to hemorrhoids. Denies any consitutional symptoms, issues with excessive bleeding, bruising or clotting. No recent history of infections or travel. 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 Married with 2 adopted children. Occupation:nurse. Denies any history of smoking, drugs, but does drink socially.Family history: mother with breast cancer, first cousin with plasma cell disorder, and other cousin with breast/rectal cancer.     REVIEW OF SYSTEMS:   Constitutional: No fever, night sweats, anorexia or malaise. +lack of energy and fatigue improved  Eyes: No change in vision, blurriness, diplopia, redness, or irritation.    ENT: No mouth sores or bleeding gums; no hoarseness of voice. No goiter. No epistaxis or other nasal problems.   No changes in hearing, vertigo, or tinnitus.   Respiratory: No coughing, wheezing, dyspnea, or hemoptysis.    Cardiovascular: No anginal symptoms, palpitations, orthopnea, or PND.+chest wall pain on the left   Gastrointestinal: No nausea, vomiting, or GERD. Denies abdominal cramping. Bowel habit is unchanged; and no melena.   Genitourinary: No urgency, frequency, dysuria, or hematuria. No hesitancy or decreased urinary stream, incomplete emptying or  incontinence.  Musculoskeletal: +joint pain, swelling, or stiffness; no back pain.  Skin: No rash or lesions.  No petechie or lower extremity edema.  Neurological: No syncope, near-syncope, or seizures; no increase in headaches or +alteration in sensorium or motor strength.  Psychiatric: Memory, short term & long term intact; no disturbance in sleep pattern and denies symptoms of depression.  Endocrine: Negative for temperature intolerance, unusual sweating, or symptoms of glucose intolerance.  Hematologic/Lymphatic: Negative for prolonged bleeding, easy bruising, and swollen nodes.    Allergic/Immunologic: No itching, or jaundice.    HISTORY:  Past Medical History:   Diagnosis Date    Angioedema     Endometriosis     Fibromyalgia, primary     Hypertension, benign 08/16/2017    Irritable bowel syndrome with constipation 09/15/2018    PAD (peripheral artery disease) [facility])      Past Surgical History:   Procedure Laterality Date    CHOLECYSTECTOMY  06/2008    COLONOSCOPY      2016    ESOPHAGOGASTRODUODENOSCOPY       Family History   Problem Relation Age of Onset    Diabetes Mother     Breast cancer Mother     Thyroid nodules Mother     Colon polyps Mother     Heart disease Father         passed away age 80    Gout Father     Colon polyps Father     Multiple sclerosis Sister     Allergic rhinitis Neg Hx     Asthma Neg Hx     Food  Allergy Neg Hx     Colon cancer Neg Hx      Current Outpatient Medications on File Prior to Visit   Medication Sig Dispense Refill    acetaminophen (TYLENOL) 500 mg tablet Take 500 mg by mouth every 6 (six) hours as needed      amLODIPine (NORVASC) 10 mg tablet Take 1 tablet (10 mg) by mouth daily 90 tablet 3    cilostazoL (PLETAL) 50 mg tablet Take 1 tablet (50 mg) by mouth 2 (two) times a day 60 tablet 5    clobetasoL (TEMOVATE) 0.05 % ointment Apply topically 2 (two) times a day (Patient not taking: Reported on 02/12/2022) 60 g 2    conjugated estrogens (Premarin) vaginal cream Premarin 0.625  mg/gram vaginal cream   INSERT 0.5 APPLICATORSFUL EVERY DAY BY VAGINAL ROUTE FOR 30 DAYS.      cycloSPORINE (RESTASIS) 0.05 % ophthalmic emulsion Restasis 0.05 % eye drops in a dropperette   INSTILL 1 DROP INTO BOTH EYES TWICE A DAY      EPINEPHrine (EPIPEN) 0.3 mg/0.3 mL injection auto-injector Inject 0.3 mL (0.3 mg) into the muscle once as needed for anaphylaxis for up to 1 dose 2 each 1    famotidine (PEPCID) 20 mg tablet Take 20 mg by mouth nightly Indications: treatment to prevent heartburn         fexofenadine (ALLEGRA) 180 mg tablet Take 180 mg by mouth nightly      hydroxychloroquine (PLAQUENIL) 200 mg tablet Take 1.5 tablets (300 mg) by mouth daily 135 tablet 3    LORazepam (ATIVAN) 1 mg tablet Take 1 tablet (1 mg) by mouth 2 (two) times a day as needed for anxiety (Patient not taking: Reported on 02/12/2022) 30 tablet 1    magnesium oxide (MAG-OX) 400 mg tablet Take 400 mg by mouth nightly         montelukast (SINGULAIR) 10 mg tablet Take 1 tablet (10 mg) by mouth daily 90 tablet 3    multivitamin (THERAGRAN) Take 1 tablet by mouth daily      mupirocin (BACTROBAN) 2 % ointment Apply 1 application. topically 3 (three) times a day (Patient not taking: Reported on 02/12/2022) 22 g 1    omega 3-dha-epa-fish oil 1,000 mg (120 mg-180 mg) 1 capsule      polyethylene glycol (MIRALAX) 17 gram packet Take by mouth daily Indications: emptying of the bowel      prucalopride (Motegrity) 2 mg tab Take 2 mg by mouth daily 30 tablet 5    vortioxetine (TRINTELLIX) 5 mg tablet Take 1 tablet (5 mg) by mouth daily (Patient not taking: Reported on 03/01/2022) 7 tablet 6    zolpidem (AMBIEN CR) 12.5 MG CR tablet Take 1 tablet (12.5 mg) by mouth nightly 90 tablet 1     No current facility-administered medications on file prior to visit.     OBJECTIVE:  PHYSICAL EXAM  There were no vitals filed for this visit.    ECOG: 0  General - Well-developed, well-nourished  in no acute distress.   HEENT - oropharynx without lesions or  hypertrophy, EOMI, PERRLA  Lymphatics - no palpable cervical, supraclavicular adenopathy.     Chest - clear to auscultation bilaterally  CVS - regular rhythm and rate, S1/S2, no murmurs, rubs or gallops.  Abdomen - soft and non-tender, without mass or hepatosplenomegaly.    Back and extremities - lower extremities without edema or erythema bilaterally.    Skin - without rash or lesions.    Neuro -  alert and oriented times 3, normal mental status and normal gait.    LABS: see EPIC    Assessment and Plan:   Iron def with microcytosis with thrombocytosis : Ms. Knab is 56 year old female with iron def. I have discussed the natural history of iron def related to production, destruction, lack of absorption or loss. Based on her recent studies noted to have low iron stores. Overall her counts have improved since completing the iron infusions and will follow up with labs.All questions and concerns have been addressed. RTC in 4  months with labs.     Immunocompromised status: Ms. Ramser has no infection at this time although she does not appear to be neutropenic.We plan to review vaccinations at our next visit (tetanus, pneumonia and flu shot) and administer them if they are not updated.     Hematology: Ms. Benning will need to have routine  count checks to make sure we are addressing/following her  transfusion requirements if needed. Hemoglobin (HgB) goal>8.0 and platelet goal is >10 (if no fever and >20 if febrile and >50 if bleeding). Plan to keep INR<1.5 and will use vitamin K PO x 3days if needed. She will have no transfusion today.     Cardiac (Hypertension/Diabetes/hypercholesterolemia): She will continue management per her PCP.Left chest wall pain will continue with supportive care and if no improvement will reach out to PCP.     GI: Ms. No has intolerance to oral iron due to GI distress. Recent colonoscopy/EGD in 2020 was negative.    Renal: Ms. Wurtzel baseline creatinine is 0.7. We will continue to follow her  closely with her renal function. Noted to have hyponatremia- plan to recheck next week. I have personally discussed the risk and symptoms of low sodium.     Social: Ms. Kontos is coping well with this diagnosis.      SIGNATURE: Carney Living, MD  DATE: May 14, 2022  TIME: 6:58 AM  Electronically signed by Carney Living, MD at 05/14/2022 10:46 AM EST

## 2022-06-27 NOTE — Telephone Encounter (Signed)
Formatting of this note might be different from the original.  Called to get referral for herself and husband to [facility]. Dub Mikes. This message was sent to [facility] to address.  Electronically signed by Sylvan Cheese, RN at 06/27/2022 12:44 PM EST

## 2022-06-27 NOTE — Case Communication (Signed)
Formatting of this note might be different from the original.  Pt would like referral to [facility] due to insurance being out of network with [employer]. Pt wishes to keep apt with Dr. Posey Pronto scheduled in March for now, and will cancel at later date incase insurance and [employer] come to an agreement.  Faxed referral to [phone]    Electronically signed by Andrew Au Major, RN at 06/27/2022  1:34 PM EST

## 2022-06-27 NOTE — Progress Notes (Signed)
Formatting of this note is different from the original.  Subjective    The patient was identified using two red rule identifiers - NEFERTITI DOWNS, MA   Patient ID: Danielle Powell is a 57 y.o. female.    Urinary Tract Infection   Episode onset: 2-3 days. The quality of the pain is described as burning. Associated symptoms include frequency.     The following portions of the patient's history were reviewed and updated as appropriate: allergies, current medications, past family history, past medical history, past social history, past surgical history, problem list, pertinent lab work, and pertinent imaging.    Review of Systems   Genitourinary:  Positive for frequency.   Musculoskeletal:  Positive for back pain.   All other systems reviewed and are negative.    Objective     Visit Vitals  BP (!) 148/88 (BP Cuff Site Location: Left arm, Patient Position: Sitting)   Pulse 88   Temp 97.5 F (36.4 C) (Oral)   Wt 58.3 kg (128 lb 9.6 oz)   SpO2 100%     Physical Exam  Constitutional:       Appearance: Normal appearance. She is well-developed.   HENT:      Head: Normocephalic and atraumatic.   Eyes:      Extraocular Movements: Extraocular movements intact.      Conjunctiva/sclera: Conjunctivae normal.      Pupils: Pupils are equal, round, and reactive to light.   Neck:      Thyroid: No thyromegaly.      Vascular: No carotid bruit.   Cardiovascular:      Rate and Rhythm: Normal rate and regular rhythm.      Heart sounds: Normal heart sounds. No murmur heard.     No friction rub. No gallop.      Comments: No lower extremity edema  Abdominal:      Comments: No cva tenderness  No suprapubic tenderness   Musculoskeletal:      Cervical back: Neck supple.      Right lower leg: No edema.      Left lower leg: No edema.   Lymphadenopathy:      Cervical: No cervical adenopathy.   Skin:     General: Skin is warm and dry.      Findings: No rash.   Neurological:      General: No focal deficit present.      Mental Status: She is alert and  oriented to person, place, and time.      Coordination: Coordination normal.      Comments: No focal deficits noted.   Psychiatric:         Mood and Affect: Mood normal.         Behavior: Behavior normal.   Pulmonary:      Effort: Pulmonary effort is normal. No respiratory distress.      Breath sounds: Normal breath sounds. No wheezing or rales.   Vitals and nursing note reviewed.     Assessment/Plan     Diagnoses and associated orders for this visit:    #1 Dysuria (Primary)  Comments:  UA not c/w UTI  placed on cipro   culture pending  Orders:  -     Urinalysis; Future  -     Urinalysis  -     Urine culture; Future      Electronically signed by Tama Headings., MD at 06/27/2022  6:38 PM EST

## 2022-08-20 ENCOUNTER — Ambulatory Visit
Admit: 2022-08-20 | Discharge: 2022-08-20 | Payer: PRIVATE HEALTH INSURANCE | Attending: Rheumatology | Primary: Family Medicine

## 2022-08-20 DIAGNOSIS — M359 Systemic involvement of connective tissue, unspecified: Secondary | ICD-10-CM

## 2022-08-20 NOTE — Progress Notes (Signed)
[facility]  Jeffie Pollock, M.D.  [address] 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 I have included the full consultation note below.  I will continue to follow your patient and will forward all future office visit notes to you.  If you have any questions or concerns about any aspect of your patient's care, please do not hesitate to contact me.    I look forward to continuing to care for this patient with you.    Sincerely,    Dr. Emmie Niemann          RHEUMATOLOGY INITIAL CONSULTATION NOTE  Date of Visit:  08/20/2022 9:30 AM    Patient Information:  Name:  Danielle Powell  DOB:  1965/11/29  Age:  57 y.o.   Gender:  female    PHYSICIAN REQUESTING CONSULTATION:  Dr. Kandra Nicolas    Chief Complaint:  Chief Complaint   Patient presents with    Consultation    Joint Pain     With a history of undifferentiated connective tissue disease    Establish Care     History of Present Illness:  Danielle Powell is a 57 y.o. 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  On talking the patient today she states that in 2015-2016 and was diagnosed as having undifferentiated connective tissue disease when she lived in [place] based on symptoms of angioedema and chillblanes.      On reviewing her records it does appear that she has been under the care of of Dr. Luz Brazen at [facility] [rheumatologist] and currently still remains on hydroxychloroquine 200 mg a pill and a half once a day.  Patient did have an eye exam in 2023 at [facility].  She did sign release of information at our office today to obtain those notes.      On reviewing her records further it  does appear that she has been under the care of the hematologist and has received IV iron infusions for iron deficiency anemia in the past.  She has been under the care of of the endocrinologist for hyperparathyroidism and osteoporosis.  She was on Forteo from 05/2020 to about 07/2021.  Her bone density has improved but remains in osteoporosis range in spine and left femoral neck and is currently on denosumab with her next shot due on 08/24/2022.  Patient is aware of the potential side effects of denosumab including rare side effect of ONJ and atypical fractures.  On talking to her she states that she has had ongoing joint pain for the last 8 to 10 years now.  Her current joint complaints are as mentioned below.    Medical records were thoroughly reviewed and history was also obtained from patient:     Overall, she says she is feeling "good".      Pain: 1/10  Location:  Pain in the MCP with some swelling with no warmth and redness of both the hands. Bilateral wrist pain wit no swelling, warmth and redness. Bilateral shoulder and para spinal muscle pain.  Some neck stiffness with no headaches. Some mid back and shoulder blade pain. Occasional lower back and  bilateral hip pain. Occasional pain with no swelling with no buckling of the knees with no warmth and redness. Some pain on the dorsum with no swelling, warmth and redness.   Quality:  Deep achy pain.   Modifying Factors:  1st thing in the morning the stiffness with pain worse at the end of the day.   Associated Symptoms:  No tingling or numbness or pain down the arms or legs.        08/20/2022     7:00 AM   DMARD/Biologic   AM Stiffness 30 min   Pain 1   Fatigue 9   MDHAQ 0   Patient Global Score 5   Medication Name Plaquenil   Date Med Started 12/19/2014     Has a h/o malar rash, has a h/o raynaud's. Some thinning alopecia with oral ulcers. Some dry eyes and is on Restasis. No dry mouth, dysphagia. Denies any h/o DVT's, PE's, miscarriages. No h/o thyroid problems.  Has a h/o anemia and had been on IV iron infusions.      History Reviewed:    Past Medical History  Past Medical History:   Diagnosis Date    ADD (attention deficit disorder)     ANA positive     Anti-RNP antibodies present     Arthritis     Depression     Endometriosis     Fibromyalgia     Generalized anxiety disorder     Hypertension     Hyperthyroidism     Irritable bowel syndrome     Lupus ([place])     Primary insomnia     Vertigo        Past Surgical History  Past Surgical History:   Procedure Laterality Date    CHOLECYSTECTOMY         Family History  Family History   Problem Relation Age of Onset    Hypertension Mother     Diabetes Mother     Cancer Mother     Heart Disease Father     Mult Sclerosis Sister     Heart Disease Other     Alzheimer's Disease Other         Mother with h/o RA. No family h/o  SLE or type 1 DM. Sister with  MS. 1st cousin with Hashimoto's thyroiditis.     Social History  Social History     Socioeconomic History    Marital status: Married     Spouse name: Not on file    Number of children: Not on file    Years of education: Not on file    Highest education level: Not on file   Occupational History    Not on file   Tobacco Use    Smoking status: Never    Smokeless tobacco: Never   Substance and Sexual Activity    Alcohol use: Yes     Alcohol/week: 1.0 standard drink of alcohol     Types: 1 Glasses of wine per week    Drug use: Not on file    Sexual activity: Not on file   Other Topics Concern    Not on file   Social History Narrative    Not on file     Social Determinants of Health     Financial Resource Strain: Not on file   Food Insecurity: Not on file   Transportation Needs: Not on file   Physical Activity: Not on file   Stress: Not on file   Social  Connections: Not on file   Intimate Partner Violence: Not on file   Housing Stability: Not on file          Allergy:  Allergies   Allergen Reactions    Baloxavir Marboxil Hallucinations    Blue Dyes (Parenteral) Anaphylaxis and Angioedema     Ibuprofen Swelling     swelling      Nsaids Swelling    Red Dye Anaphylaxis, Angioedema and Hives    Other Swelling     Medication not listed in Epic for Influenza    Prochlorperazine Edisylate Rash         Current Medications:  Current Outpatient Medications   Medication Sig Dispense Refill    hydroxychloroquine (PLAQUENIL) 200 MG tablet       amLODIPine (NORVASC) 10 MG tablet TAKE 1 TABLET (5 MG) BY MOUTH NIGHTLY INDICATIONS: HIGH BLOOD PRESSURE      EPINEPHrine (EPIPEN) 0.3 MG/0.3ML SOAJ injection epinephrine 0.3 mg/0.3 mL injection, auto-injector   INJECT 0.3 ML (0.3 MG) INTO THE MUSCLE ONCE AS NEEDED FOR ANAPHYLAXIS FOR UP TO 1 DOSE      Prucalopride Succinate (MOTEGRITY) 2 MG TABS       montelukast sodium (SINGULAIR) 4 MG PACK       fexofenadine (ALLEGRA ALLERGY) 180 MG tablet Allegra Allergy      amphetamine-dextroamphetamine (ADDERALL) 20 MG tablet Take at 2:00      famotidine (PEPCID) 20 MG tablet Take 20 mg by mouth      Lisdexamfetamine Dimesylate (VYVANSE) 40 MG CAPS Vyvanse 40 mg capsule   TAKE 1 CAPSULE (40 MG) DAILY MAX DAILY AMOUNT: 40 MG      LORazepam (ATIVAN) 1 MG tablet Take 1 mg by mouth 2 times daily as needed.      zolpidem (AMBIEN) 5 MG tablet zolpidem 5 mg tablet   TAKE 1 TABLET BY MOUTH NIGHTLY      Omega-3 Fatty Acids (FISH OIL) 1000 MG capsule 1 capsule      Magnesium 300 MG CAPS 1 capsule with a meal Orally Once a day for 30 day(s)      raNITIdine (ZANTAC) 150 MG/6ML injection 1 tablet at bedtime Orally Once a day for 30 day(s)      cycloSPORINE (RESTASIS) 0.05 % ophthalmic emulsion Restasis 0.05 % eye drops in a dropperette   INSTILL 1 DROP INTO BOTH EYES TWICE A DAY      estradiol (ESTRACE VAGINAL) 0.1 MG/GM vaginal cream as directed Vaginal nightly for 2-3 weeks, then 2-3 times weekly for 90 days      estrogens conjugated (PREMARIN) 0.625 MG/GM CREA vaginal cream Premarin 0.625 mg/gram vaginal cream   INSERT 0.5 APPLICATORSFUL EVERY DAY BY VAGINAL ROUTE FOR 30 DAYS.       hydroCHLOROthiazide (HYDRODIURIL) 25 MG tablet 1 tablet Orally Once a day for 90 days      linaclotide (LINZESS) 290 MCG CAPS capsule Linzess 290 mcg capsule       No current facility-administered medications for this visit.       Review Of Systems:fatigue, eye dryness, itching eyes, nosebleeds, dryness in nose, sores in mouth, frequent sore throats, hoarseness, HTN, swollen legs or feet, increasing constipation, vaginal dryness, morning stiffness, joint pain, muscle weakness, joint swelling, rash, hair loss, color changes of hands or feet in the cold, HA's, memory loss, anxiety, difficulty staying asleep, swollen glands, tender glands    Physical Exam:  Height 1.524 m (5'), weight 56.7 kg (125 lb).  General:  Patient alert, cooperative and  in no apparent distress.  HEENT: Pupils equally reactive to light and accomodation, no scleral injection noted. Neck supple, no lymphadenopathy, no thyromegaly.    Skin:  No rashes. No nail abnormalities.  Cardiac:  Normal rate and rhythm.  No murmurs, rubs or gallops appreciated.  Lungs: Clear to auscultation bilaterally.  Abdomen: Soft, non tender with no hepatosplenomegaly.  Neurologic:  Oriented, normal speech and affect.  Normal gait.    Extremities:  No edema in bilateral lower extremities with no cyanosis or clubbing.      Muskoskeletal Exam:     I examined the neck, spine, shoulders, elbows, wrists, MCPs, PIPs, DIPs, knees, hips, ankles and feet bilaterally for strength, range of motion, deformity, tenderness, swelling, and synovitis.      The findings are:      Physical Exam              Joint Exam 08/20/2022        Right  Left   Glenohumeral   Tender   Tender    Comments  Tenderness anteriorly but not superiorly with intact range of motion to abduction as well as internal rotation.  Tenderness anteriorly but not superiorly with intact range of motion to abduction as well as internal rotation.     Wrist   Tender      PIP 2   Tender   Tender   PIP 3   Tender   Tender    PIP 4   Tender   Tender   PIP 5   Tender   Tender   Cervical Spine   Tender      Thoracic Spine   Tender      Sacroiliac   Tender      Knee   Tender   Tender      Patient otherwise has a normal joint exam without other evidence of joint tenderness, synovitis, warmth, erythema, decreased ROM, weakness or deformities.     Radiology Reports Reviewed (if available):  Last 3 months    11/17/21: DXA (Hologic, performed at [facility])  L1-L4 spine: BMD: 0.764 g/cm2, T-score -2.6, Z-score -1.5  Left neck: BMD: 0.524 g/cm2, T-score -2.9, Z-score -1.9  Right neck: BMD: 0.593 g/cm2, T-score -2.3, Z-score -1.3  Left total hip: BMD: 0.689 g/cm2, T-score -2.1, Z-score -1.4  Right total hip: BMD: 0.723 g/cm2, T-score -1.8, Z-score -1.1     ------------------------------------------------------------------------------------------------  11/13/19: DXA: Osteoporosis (Hologic, [place])  L1-L4 spine: BMD: 0.720 g/cm2, T-score -3, Z-score -2.1  Left neck: BMD: 0.516 g/cm2, T-score -3, Z-score -2.1  Right neck: BMD: 0.550 g/cm2, T-score -2.7, Z-score -1.8  Left total hip: BMD: 0.687 g/cm2, T-score -2.1, Z-score -1.5  Right total hip: BMD: 0.686 g/cm2, T-score -2.1, Z-score -1.5       Lab Reports Reviewed (if available):     The results above were reviewed and discussed with patient.      Assessment/Plan:   Danielle Powell is a 57 y.o. female who presents with:     Undifferentiated connective tissue disease ([place]): She was instructed to continue hydroxychloroquine 200 mg a pill and a half to be taken once a day after food.  Lab results from 08/16/2022 which included a CBC and a BMP done at an outside facility were reviewed with the patient today.    Osteopenia of multiple sites: Since patient did have a vitamin D 25 hydroxy level checked on 12/07/2021 which was found to be normal at 38, I plan on repeating the vitamin D 25-hydroxy  level on follow-up visit with me.  Since she is due for her next denosumab injection on after 08/24/2022, I will tell  my medical assistant to run a prior approval for the drug and get her in for her next Prolia shot.  Prior to the Prolia patient was on Forteo just over a year.  Patient was instructed to continue vitamin D over-the-counter for now along with occasional Tums [since she does have a history of hyperparathyroidism I would not recommend daily calcium intake though].  Patient's last DEXA scan was done on 11/17/2021 and hence she will be due for her next DEXA scan on or after 11/19/2023.    I will see the patient back in approximately 3 months time, by which time she should have come in for her next Prolia shot at our office.    I appreciate the consult and the opportunity to participate in the care of this patient.      Electronically signed by:  Jeffie Pollock, MD      This note was dictated using dragon voice recognition software.  It has been proofread, but there may still exist voice recognition errors that the author did not detect.          ---------------------------------------------------------------------------------------------------------------------------------------------------------------------------------------------------------------------------------

## 2022-08-24 NOTE — Telephone Encounter (Signed)
BIF states Prolia is not covered. Message to Eveline to call [facility] and speak with rep. Pharmacy benefits?

## 2022-08-24 NOTE — Telephone Encounter (Signed)
-----   Message from [facility] sent at 08/24/2022  9:57 AM EST -----  Regarding: RE: prolia  2024 - Prolia - [facility] ded 0 OOP 5000.00 met 421.57 Rem: [id number]. Bif sent 08/22/2022 eh  bif rec: [facility] stating patient is not covered for this benefit at this time. 08/24/2022 eh     ----- Message -----  From: Tommy Medal, MA  Sent: 08/22/2022  10:30 AM EST  To: Vonna Kotyk; Tommy Medal, MA  Subject: FWVena Austria, can you check benefits for Prolia? Thanks  ----- Message -----  From: Tommy Medal, MA  Sent: 08/20/2022  11:45 AM EST  To: Tommy Medal, MA  Subject: Dorise Bullion, MD  Tommy Medal, MA  Patient due for her next Prolia shot on or after 08/24/2022.  Please can you run a PA on the drug through our office, and notify the patient to come in for her Prolia shot.  Her last vitamin D level was checked in June of last year and I do plan on checking it again in June of this year but not prior to her Prolia shot though.  She did have a BMP done 08/16/2022 which showed a normal calcium and normal serum creatinine.

## 2022-08-30 NOTE — Progress Notes (Signed)
 NEW PATIENT INTAKE    Referral Diagnosis: Iron  deficiency anemia, unspecified iron  deficiency anemia type      Referring Provider: Douglass Ivanoff, MD    Primary Care Provider: Jayne America, PA    Family History of Cancer/ Hematology Disorders: Mother with breast cancer, first cousin with plasma cell disorder and another cousin with breast and rectal cancer.    Presenting Symptoms: IDA    Chronological History of Pertinent Events:      57 year old white female    Followed by Rheumatology, Vascular Surgery, Hematology, Endocrinology, Orthopedics    CURRENT:  12/27/21 - last IV iron  infusion received. (CE)    05/14/22 - Last OV with Hematologist at [facility], Cozette Blanch, MD (CE)  Here for follow-up of IDA  Patient with PMH: CTD, PAD, fibromyalgia, HTN presents for follow of IDA.   She reports that she has been doing better since the iron  infusion.   Notes intermittent bouts of fatigue and generalized joint pain related to change in season.   Eating and drinking well.   Reports husband having similar issues with IDA related to hemorrhoids.   Denies any excessive bleeding, bruising or clotting.   Assessment and Plan:   Iron  def with microcytosis with thrombocytosis.  Discussed the natural history of iron  def related to production, destruction, lack of absorption or loss.   Based on her recent studies noted to have low iron  stores.   Overall, her counts have improved since completing the iron  infusions and will follow up with labs.  RTC in 4 months with labs.     Hematology: Patient will need to have routine count checks to make sure we are addressing/following her transfusion requirements if needed. Hemoglobin (HgB) goal>8.0 and platelet goal is >10 (if no fever and >20 if febrile and >50 if bleeding). Plan to keep INR<1.5 and will use vitamin K PO x 3days if needed. She will have no transfusion today.     Cardiac (Hypertension/Diabetes/hypercholesterolemia): She will continue management per her PCP. Left chest wall pain  will continue with supportive care and if no improvement will reach out to PCP.     GI: Ms. Brick has intolerance to oral iron  due to GI distress. Recent colonoscopy/EGD in 2020 was negative.    Renal: Ms. Kees baseline creatinine is 0.7. Will continue to follow her closely with her renal function. Noted to have hyponatremia - plan to recheck next week. I have personally discussed the risk and symptoms of low sodium.     A referral has been placed with [facility] for a Medical Hematology transfer of care, consultation, and treatment.    (CE)        Notes from Referring Provider: N/A    Presented at Tumor Board: No    Other Pertinent Information: N/A

## 2022-09-04 ENCOUNTER — Ambulatory Visit: Admit: 2022-09-04 | Payer: PRIVATE HEALTH INSURANCE | Attending: Internal Medicine | Primary: Family Medicine

## 2022-09-04 ENCOUNTER — Inpatient Hospital Stay: Admit: 2022-09-04 | Payer: PRIVATE HEALTH INSURANCE | Primary: Family Medicine

## 2022-09-04 DIAGNOSIS — D649 Anemia, unspecified: Secondary | ICD-10-CM

## 2022-09-04 LAB — CBC WITH AUTO DIFFERENTIAL
Absolute Immature Granulocyte: 0 10*3/uL (ref 0.0–0.5)
Basophils %: 1 % (ref 0.0–2.0)
Basophils Absolute: 0.1 10*3/uL (ref 0.0–0.2)
Eosinophils %: 0 % — ABNORMAL LOW (ref 0.5–7.8)
Eosinophils Absolute: 0 10*3/uL (ref 0.0–0.8)
Hematocrit: 43.9 % (ref 35.8–46.3)
Hemoglobin: 14.5 g/dL (ref 11.7–15.4)
Immature Granulocytes: 0 % (ref 0.0–5.0)
Lymphocytes %: 25 % (ref 13–44)
Lymphocytes Absolute: 1.8 10*3/uL (ref 0.5–4.6)
MCH: 30.1 PG (ref 26.1–32.9)
MCHC: 33 g/dL (ref 31.4–35.0)
MCV: 91.1 FL (ref 82.0–102.0)
MPV: 8.9 FL — ABNORMAL LOW (ref 9.4–12.3)
Monocytes %: 7 % (ref 4.0–12.0)
Monocytes Absolute: 0.5 10*3/uL (ref 0.1–1.3)
Neutrophils %: 67 % (ref 43–78)
Neutrophils Absolute: 4.7 10*3/uL (ref 1.7–8.2)
Platelets: 326 10*3/uL (ref 150–450)
RBC: 4.82 M/uL (ref 4.05–5.2)
RDW: 12.5 % (ref 11.9–14.6)
WBC: 7.1 10*3/uL (ref 4.3–11.1)
nRBC: 0 10*3/uL (ref 0.0–0.2)

## 2022-09-04 LAB — COMPREHENSIVE METABOLIC PANEL
ALT: 31 U/L (ref 12–65)
AST: 25 U/L (ref 15–37)
Albumin/Globulin Ratio: 1 (ref 0.4–1.6)
Albumin: 3.9 g/dL (ref 3.5–5.0)
Alk Phosphatase: 48 U/L — ABNORMAL LOW (ref 50–136)
Anion Gap: 6 mmol/L (ref 2–11)
BUN: 18 MG/DL (ref 6–23)
CO2: 29 mmol/L (ref 21–32)
Calcium: 9.2 MG/DL (ref 8.3–10.4)
Chloride: 102 mmol/L — ABNORMAL LOW (ref 103–113)
Creatinine: 0.8 MG/DL (ref 0.6–1.0)
Est, Glom Filt Rate: >60 mL/min/{1.73_m2} (ref >60)
Globulin: 4.1 g/dL (ref 2.8–4.5)
Glucose: 92 mg/dL (ref 65–100)
Potassium: 4.1 mmol/L (ref 3.5–5.1)
Sodium: 137 mmol/L (ref 136–146)
Total Bilirubin: 0.4 MG/DL (ref 0.2–1.1)
Total Protein: 8 g/dL (ref 6.3–8.2)

## 2022-09-04 LAB — TRANSFERRIN SATURATION
Iron: 112 ug/dL (ref 35–150)
TIBC: 385 ug/dL (ref 250–450)
TRANSFERRIN SATURATION: 29 % (ref >20)

## 2022-09-04 LAB — FERRITIN: Ferritin: 16 NG/ML (ref 8–388)

## 2022-09-04 NOTE — Progress Notes (Signed)
[facility]: Office Visit New Patient H & P    Chief Complaint:    Chief Complaint   Patient presents with    New Patient         History of Present Illness:  Danielle Powell is a 57 y.o. female who presents today for evaluation regarding iron deficiency anemia.  She has been previously followed at [facility] hematology, she is receiving intermittent iron infusions for IDA, most recently in July 2023.  She saw them in November 2023 and her iron stores were improving so no treatment was given, she has been intolerant to oral iron historically.  Of note she has a history of connective tissue disease and is followed by rheumatology.  Most recent labs at primary care showed a Hgb of 15.1 with no evidence of microcytosis, iron studies were not done at that time.  She now presents to [facility] for evaluation as she has [employer] and the ongoing network dispute with [facility] [facility] her OON for her previous hematologist.  She is doing well, no active complaints, no bleeding, no anemia symptoms currently.      Review of Systems:  Constitutional: Negative.   HENT: Negative.   Eyes: Negative.   Respiratory: Negative.   Cardiovascular: Negative.   Gastrointestinal: Negative.   Genitourinary: Negative.   Musculoskeletal: Negative.   Skin: Negative.   Neurological: Negative.   Endo/Heme/Allergies: Negative.   Psychiatric/Behavioral: Negative.   All other systems reviewed and are negative.     Allergies   Allergen Reactions    Baloxavir Marboxil Hallucinations     Other Reaction(s): Hallucinations, Hallucinations-Intolerance    Blue Dyes (Parenteral) Anaphylaxis and Angioedema     Other Reaction(s): Angioedema-Allergy    Ibuprofen Swelling     swelling      Nsaids Swelling     Other Reaction(s): Angioedema-Allergy    Red Dye Anaphylaxis, Angioedema and Hives     Other Reaction(s): Angioedema    Other Swelling     Medication not listed in Epic for Influenza    Other Reaction(s): Joint swelling-Intolerance       Medication not listed in Epic for Influenza    Prochlorperazine Rash     Other Reaction(s): Rash-Allergy    Prochlorperazine Edisylate Rash     Past Medical History:   Diagnosis Date    ADD (attention deficit disorder)     ANA positive     Anti-RNP antibodies present     Arthritis     Depression     Endometriosis     Fibromyalgia     Generalized anxiety disorder     Hypertension     Hyperthyroidism     Irritable bowel syndrome     Lupus (HCC)     Primary insomnia     Vertigo      Past Surgical History:   Procedure Laterality Date    CHOLECYSTECTOMY       Family History   Problem Relation Age of Onset    Hypertension Mother     Diabetes Mother     Cancer Mother     Heart Disease Father     Mult Sclerosis Sister     Heart Disease Other     Alzheimer's Disease Other      Social History     Socioeconomic History    Marital status: Married     Spouse name: Not on file    Number of children: Not on file    Years of education:  Not on file    Highest education level: Not on file   Occupational History    Not on file   Tobacco Use    Smoking status: Never    Smokeless tobacco: Never   Substance and Sexual Activity    Alcohol use: Yes     Alcohol/week: 1.0 standard drink of alcohol     Types: 1 Glasses of wine per week    Drug use: Not on file    Sexual activity: Not on file   Other Topics Concern    Not on file   Social History Narrative    Not on file     Social Determinants of Health     Financial Resource Strain: Not on file   Food Insecurity: Not on file   Transportation Needs: Not on file   Physical Activity: Not on file   Stress: Not on file   Social Connections: Not on file   Intimate Partner Violence: Not on file   Housing Stability: Not on file     Current Outpatient Medications   Medication Sig Dispense Refill    amLODIPine (NORVASC) 10 MG tablet Take 1 tablet by mouth daily      hydroxychloroquine (PLAQUENIL) 200 MG tablet Take 1 tablet by mouth daily      EPINEPHrine (EPIPEN) 0.3 MG/0.3ML SOAJ injection  epinephrine 0.3 mg/0.3 mL injection, auto-injector   INJECT 0.3 ML (0.3 MG) INTO THE MUSCLE ONCE AS NEEDED FOR ANAPHYLAXIS FOR UP TO 1 DOSE      montelukast sodium (SINGULAIR) 4 MG PACK       fexofenadine (ALLEGRA ALLERGY) 180 MG tablet Take 1 tablet by mouth daily      famotidine (PEPCID) 20 MG tablet Take 1 tablet by mouth daily      LORazepam (ATIVAN) 1 MG tablet Take 1 tablet by mouth 2 times daily as needed.      zolpidem (AMBIEN) 5 MG tablet zolpidem 5 mg tablet   TAKE 1 TABLET BY MOUTH NIGHTLY      Omega-3 Fatty Acids (FISH OIL) 1000 MG capsule 1 capsule      Magnesium 300 MG CAPS 1 capsule with a meal Orally Once a day for 30 day(s)      cycloSPORINE (RESTASIS) 0.05 % ophthalmic emulsion Restasis 0.05 % eye drops in a dropperette   INSTILL 1 DROP INTO BOTH EYES TWICE A DAY      estrogens conjugated (PREMARIN) 0.625 MG/GM CREA vaginal cream Premarin 0.625 mg/gram vaginal cream   INSERT 0.5 APPLICATORSFUL EVERY DAY BY VAGINAL ROUTE FOR 30 DAYS.      Prucalopride Succinate (MOTEGRITY) 2 MG TABS Take 2 mg by mouth daily (Patient not taking: Reported on 09/04/2022)       No current facility-administered medications for this visit.       OBJECTIVE:  BP (!) 141/96   Pulse 73   Temp 97.9 F (36.6 C) (Oral)   Resp 16   Ht 1.524 m (5')   Wt 58.9 kg (129 lb 14.4 oz)   SpO2 98%   BMI 25.37 kg/m     Physical Exam:  Constitutional: Well developed, well nourished female in no acute distress, sitting comfortably on the examination table.    HEENT: Normocephalic and atraumatic. Sclerae anicteric. Neck supple without JVD. No thyromegaly present.    Lymph node   No palpable submandibular, cervical, supraclavicular lymph nodes.   Skin Warm and dry.  No bruising and no rash noted.  No erythema.  No pallor.  Respiratory Lungs are clear to auscultation bilaterally without wheezes, rales or rhonchi, normal air exchange without accessory muscle use.    CVS Normal rate, regular rhythm and normal S1 and S2.  No murmurs,  gallops, or rubs.   Neuro Grossly nonfocal with no obvious sensory or motor deficits.   MSK Normal range of motion in general.  No edema and no tenderness.   Psych Appropriate mood and affect.      Labs:  No results found for this or any previous visit (from the past 24 hour(s)).    Imaging:  No results found.    ASSESSMENT:   Diagnosis Orders   1. Anemia, unspecified type  CBC with Auto Differential    Comprehensive Metabolic Panel    Transferrin Saturation    Ferritin    CBC with Auto Differential    Comprehensive Metabolic Panel    Transferrin Saturation    Ferritin    CBC with Auto Differential    Comprehensive Metabolic Panel    Transferrin Saturation    Ferritin        Patient Active Problem List   Diagnosis    EBV infection    Lymphadenopathy           PLAN:  Lab studies were personally reviewed.    Pertinent old records were reviewed from [facility]: treated in the past with IV iron at [facility], most recently July 2023 with subsequent improvement.  Recent CBC in February 2024 showed Hgb of 15.1 with normal MCV, no iron studies performed at that time.  We will repeat her labs today, but with the normal Hgb just last month I would be quite surprised if she needs additional parenteral iron at this time.  We will go ahead and schedule a 4 month follow-up for recheck on that assumption.  All questions were asked and answered to the best of my ability.  In all, I spent 30 minutes in the care of Danielle Powell today, over 50% of which was in direct counseling and coordination of care.          Greer Ee, MD  [facility] [place] [place] [address]  Office : [phone]  Fax : [phone]

## 2022-09-04 NOTE — Patient Instructions (Addendum)
Patient Instructions from Today's Visit    Reason for Visit:  New Patient-Iron Deficiency Anemia    Plan:  History of Iron Deficiency reviewed. We will draw some labs today but we don't expect to need any IV Iron at this time.    Follow Up:  Follow up in 4 months.    Recent Lab Results:  We will draw some labs today.    Treatment Summary has been discussed and given to patient: N/A        -------------------------------------------------------------------------------------------------------------------  Please call our office at [phone] if you have any  of the following symptoms:   Fever of 100.5 or greater  Chills  Shortness of breath  Swelling or pain in one leg    After office hours an answering service is available and will contact a provider for emergencies or if you are experiencing any of the above symptoms.    Patient did express an interest in My Chart.  My Chart log in information explained on the after visit summary printout at the [place] desk.    Denishia Citro, RN

## 2022-09-10 NOTE — Telephone Encounter (Signed)
PHONE CALL to [facility] to verify benefits for Prolia [id number]. Will send fax with eligibility info. Use passcode on first page to call back if need more info  [email or web address] to submit PA, check status.   Call [phone], Passcode on Fax [phone].   Ded 1500 ($110 met) 80% coverage until meet OOP max $5000 ($516.54 met)    Spoke with [place] under office sx   80% coin after ded, no copay applies  PA- required- PA dept [id number].    Ref # for call [id number].     PHONE CALL to PA dept. Started auth over the phone. Pending auth [id number].   [email or web address] upload documents. Provided NP visit and last BMD. Uploaded to [facility] site.

## 2022-09-12 ENCOUNTER — Encounter: Payer: PRIVATE HEALTH INSURANCE | Attending: Rheumatology | Primary: Family Medicine

## 2022-09-18 NOTE — Telephone Encounter (Signed)
PHONE CALL from [place] with [facility] requesting additional information for the Prolia auth. He needs her start date and dosing and frequency.   Called him back at [phone], ext [phone]. LEFT MESSAGE on his VM with Prolia dosing 60 mg/ml every 6 months. Forteo therapy 05/2020- 07/2021. Assuming Prolia started March 2023? Provided my phone number for him to call with any other questions.   Case # [id number].

## 2022-09-26 ENCOUNTER — Encounter

## 2022-09-26 NOTE — Telephone Encounter (Signed)
Letter received in the mail today. Patient has been approved for Prolia for 1 injection 09/10/22- 09/09/23. Reference # [id number].     PHONE CALL to patient to schedule the Prolia. Scheduled her for 09/27/22 2:00. She had her last injection 02/22/23. (Dr. Billey Chang)

## 2022-09-27 ENCOUNTER — Ambulatory Visit: Admit: 2022-09-27 | Discharge: 2022-10-11 | Payer: PRIVATE HEALTH INSURANCE | Primary: Family Medicine

## 2022-09-27 ENCOUNTER — Encounter: Admit: 2022-09-27 | Discharge: 2022-09-27 | Payer: PRIVATE HEALTH INSURANCE | Primary: Family Medicine

## 2022-09-27 DIAGNOSIS — E060 Acute thyroiditis: Secondary | ICD-10-CM

## 2022-09-27 DIAGNOSIS — M8589 Other specified disorders of bone density and structure, multiple sites: Secondary | ICD-10-CM

## 2022-09-27 MED ORDER — DENOSUMAB 60 MG/ML SC SOSY
60 | Freq: Once | SUBCUTANEOUS | Status: AC
Start: 2022-09-27 — End: 2022-09-27
  Administered 2022-09-27: 19:00:00 60 mg via SUBCUTANEOUS

## 2022-09-27 NOTE — Progress Notes (Signed)
[facility]  [address]  [place], [place] [address]  Office : [phone], Fax: [phone]     #2 Prolia 60mg /ml injected SC today into right arm. Patient tolerated injection well. Advised patient to continue with calcium and vitamin D post injection. Advised patient to call with any problems post injection.   Medication ordered by Dr. Chriss Czar.   Injection given by Zada Finders, RT.   Last given 02/21/22 with Dr. Billey Chang. Previously treated with Forteo 05/2020- 07/2021    Pre-infusion/injection questionairre for osteoporosis    1. Have you had or are you planning any dental work?  no    2. Are you taking your calcium and vitamin D? yes    3. When was your last osteoporosis treatment? 02/22/23    4. Have you had any recent fractures? no    5. Are you currently in skilled nursing or in patient rehab? no

## 2022-09-28 ENCOUNTER — Encounter

## 2022-10-09 ENCOUNTER — Ambulatory Visit
Admit: 2022-10-09 | Discharge: 2022-10-09 | Payer: PRIVATE HEALTH INSURANCE | Attending: Rheumatology | Primary: Family Medicine

## 2022-10-09 NOTE — Progress Notes (Signed)
[facility]  Danielle Powell, M.D.  [address]   [place], [id number]  Office : [phone], Fax: [phone]     RHEUMATOLOGY OFFICE VISIT NOTE  Date of Visit:  10/09/2022 8:46 AM    Patient Information:  Name:  Danielle Powell  DOB:  Nov 30, 1965  Age:  57 y.o.   Gender:  female      Danielle Powell is here today for follow-up of undifferentiated connective tissue disease, osteopenia of multiple sites and medication monitoring.     Last visit: 08/20/22    History of Present Illness: On talking to the patient today she states that she did have infectious mononucleosis in February of 2024.  Since then she has had increased swelling in her throat and has had more pain in her joints with a rash on her chest, ear, and nose.  Currently she does complain of having pain in her right hip and left jaw that is new.  Patient states that she had an eye exam around June 2023 at [facility]. She will call and have them fax the eye note over to [place].  With regard to her seasonal allergies and angioedema she has been taking Allegra and was instructed to follow-up with the allergist for now.  Currently she does complain of having some hoarseness but no cough or congestion though.  Her current joint complaints are as mentioned below.    Since the last visit, patient is feeling "fair".    Pain: 4/10  Location:  Some right hip pain with some lower back pain. Some neck stiffness with no pain with neck ROM. Occasional tension headaches. Some left jaw and left shoulder pain from a torn rotator cuff. Some right elbow pain with no swelling, warmth and redness. Bilateral right wrist pain worse than the left wrist pain with no swelling, warmth and redness. Some pain and swelling of the MCP joints with no warmth and redness. Bilateral thumb pain with no swelling, warmth and redness. Some pain with swelling on the dorsum of her feet and ankles. No buckling of her ankles.   Quality:  Deep achy  pain.   Modifying Factors:  Tylenol arthritis 2 pills twice a day.   Associated Symptoms: No tingling, numbness or pain down the arms or legs. No UE or LE weakness. Some weakness of her hands. Needs help with opening jars with no other limitations with her ADL's.         10/09/2022     8:00 AM   DMARD/Biologic   AM Stiffness 30 min   Pain 4   Fatigue 8   MDHAQ 0   Patient Global Score 5   Medication Name Plaquenil     Last TB screen:years ago  TB result:always negative    Current dose of steroids:none  How long on current dose of steroids:NA  How long on continuous steroid therapy:NA    Past DMARDs, if applicable (methotrexate, plaquenil/hydroxychloroquine, sulfasalazine, Arava/leflunomide):Currently taking Plaquenil 200 mg QD.     Past biologics, if applicable (enbrel, humira, simponi, cimzia, Harriette Ohara, [place], remicade, simponi aria, actemra, rituximab, Henderson Pangburn, stelara, cosentyx):none    Past NSAIDs, if applicable (motrin, aleve, naproxen, advil, ibuprofen, celebrex, voltaren/diclofenac, etc.):none due to angioedema      Last BMD:11/17/21  Past osteoporosis drugs, if applicable (fosamax, actonel, boniva, reclast, prolia, forteo):Forteo in the past. Prolia #2 given on 09/27/22.    BMI:25.23  Current exercise regimen, if any:gym 4x weekly  Current vitamin D dose: Vit  D3- 5000 IU a day and one MV qd  Current calcium dose:none  Fractures since last visit, if any: none    The patient otherwise has no significant interval changes in health or medical history to report.     History Reviewed:    Past Medical History  Past Medical History:   Diagnosis Date    ADD (attention deficit disorder)     ANA positive     Anti-RNP antibodies present     Arthritis     Depression     Endometriosis     Fibromyalgia     Generalized anxiety disorder     Hypertension     Hyperthyroidism     Irritable bowel syndrome     Lupus (HCC)     Mononucleosis     Primary insomnia     Vertigo        Past Surgical History  Past Surgical History:    Procedure Laterality Date    CHOLECYSTECTOMY         Family History  Family History   Problem Relation Age of Onset    Hypertension Mother     Diabetes Mother     Cancer Mother     Heart Disease Father     Mult Sclerosis Sister     Heart Disease Other     Alzheimer's Disease Other        Social History  Social History     Socioeconomic History    Marital status: Married     Spouse name: None    Number of children: None    Years of education: None    Highest education level: None   Tobacco Use    Smoking status: Never    Smokeless tobacco: Never   Substance and Sexual Activity    Alcohol use: Yes     Alcohol/week: 1.0 standard drink of alcohol     Types: 1 Glasses of wine per week    Drug use: Yes     Types: Other     Comment: Takes Ativan and Ambien. Never used street drugs.               Allergy:  Allergies   Allergen Reactions    Baloxavir Marboxil Hallucinations     Other Reaction(s): Hallucinations, Hallucinations-Intolerance    Blue Dyes (Parenteral) Anaphylaxis and Angioedema     Other Reaction(s): Angioedema-Allergy    Ibuprofen Swelling     swelling      Nsaids Swelling     Other Reaction(s): Angioedema-Allergy    Red Dye Anaphylaxis, Angioedema and Hives     Other Reaction(s): Angioedema    Other Swelling     Medication not listed in Epic for Influenza    Other Reaction(s): Joint swelling-Intolerance      Medication not listed in Epic for Influenza    Prochlorperazine Rash     Other Reaction(s): Rash-Allergy    Prochlorperazine Edisylate Rash         Current Medications:  Outpatient Encounter Medications as of 10/09/2022   Medication Sig Dispense Refill    linaclotide (LINZESS) 290 MCG CAPS capsule Take 1 capsule by mouth daily      amLODIPine (NORVASC) 10 MG tablet Take 1 tablet by mouth daily      hydroxychloroquine (PLAQUENIL) 200 MG tablet Take 1 tablet by mouth daily      EPINEPHrine (EPIPEN) 0.3 MG/0.3ML SOAJ injection epinephrine 0.3 mg/0.3 mL injection, auto-injector   INJECT 0.3 ML (0.3 MG) INTO  THE MUSCLE ONCE AS NEEDED FOR ANAPHYLAXIS FOR UP TO 1 DOSE      montelukast sodium (SINGULAIR) 4 MG PACK       fexofenadine (ALLEGRA ALLERGY) 180 MG tablet Take 1 tablet by mouth daily      famotidine (PEPCID) 20 MG tablet Take 1 tablet by mouth daily      LORazepam (ATIVAN) 1 MG tablet Take 1 tablet by mouth 2 times daily as needed.      zolpidem (AMBIEN) 5 MG tablet zolpidem 5 mg tablet   TAKE 1 TABLET BY MOUTH NIGHTLY      Omega-3 Fatty Acids (FISH OIL) 1000 MG capsule 1 capsule      Magnesium 300 MG CAPS 1 capsule with a meal Orally Once a day for 30 day(s)      cycloSPORINE (RESTASIS) 0.05 % ophthalmic emulsion Restasis 0.05 % eye drops in a dropperette   INSTILL 1 DROP INTO BOTH EYES TWICE A DAY      estrogens conjugated (PREMARIN) 0.625 MG/GM CREA vaginal cream Premarin 0.625 mg/gram vaginal cream   INSERT 0.5 APPLICATORSFUL EVERY DAY BY VAGINAL ROUTE FOR 30 DAYS.      [DISCONTINUED] Prucalopride Succinate (MOTEGRITY) 2 MG TABS Take 2 mg by mouth daily (Patient not taking: Reported on 09/04/2022)       No facility-administered encounter medications on file as of 10/09/2022.           REVIEW OF SYSTEMS: The following systems were reviewed with patient today and were negative except for the following (depicted with an "X"):        "X" General  "X" Head and Neck  "X" Heart and Breathing  "X" Gastrointestinal    Fever/chills   Hair loss   Shortness of breath  x Upset stomach    Falls   Dry mouth   Coughing  x Diarrhea / constipation    Wt loss   Mouth sores   Wheezing   Heartburn   x Wt gain   Ringing ears   Chest pain   Dark or bloody stools    Night sweats   Diff. swallowing  X None of above   Nausea or vomiting    None of above  X None of above      None of above                "X" Skin  "X" Neurology  "X" Urinary/Gyn  "X" Other   x Easy bruising  x Numbness/ tingling   Female problems  x Depression   x Rashes  x Weakness   Problems with urination   Feeling anxious    Sun sensitivity  x Headaches  X None of  above  x Problems sleeping    None of above   None of above      None of above          Physical Exam:  Blood pressure (!) 141/95, pulse 59, height 1.524 m (5'), weight 58.6 kg (129 lb 3.2 oz).  General:  Patient alert, cooperative and in no apparent distress.  HEENT: Pupils equally reactive to light and accommodation, no scleral injection noted.  Neck supple, no lymphadenopathy, no thyromegaly.  Heart: Regular rate and rhythm, normal S1 and S2, no rubs or gallops.  Lungs: Clear to auscultation bilaterally.  Abdomen: Soft, nontender, no hepatosplenomegaly.  Skin:  No rashes. No nail abnormalities.  Neurologic:  Oriented, normal speech and affect.  Normal gait.    Extremities:  No  edema in bilateral lower extremities with no cyanosis or clubbing.    Muskoskeletal Exam:     I examined the shoulders, elbows, wrists, MCPs, PIPs, DIPs and knees bilaterally for strength, range of motion, deformity, tenderness, swelling, and synovitis.      The findings are:      Physical Exam              Joint Exam 10/09/2022        Right  Left   Elbow      Tender    Comments    Tenderness medially as well as laterally with no synovitis, warmth or redness.  Flexion as well as extension of the left elbow is intact.   Wrist   Tender   Tender   Sacroiliac   Tender      Knee   Tender   Tender   Ankle   Tender   Tender   Tarsometatarsal   Tender   Tender     Patient otherwise has a normal joint exam without other evidence of joint tenderness, synovitis, warmth, erythema, decreased ROM, weakness or deformities.     Radiology Reports Reviewed (if available):  Last 3 months     [place] HEAD NECK SOFT TISSUE THYROID  Narrative: THYROID ULTRASOUND    INDICATION: Acute thyroiditis.    COMPARISON: None.    TECHNIQUE: Multiple grayscale and limited color Doppler images of the thyroid  gland were obtained.    FINDINGS:  Right lobe: The right lobe is homogeneous in echotexture throughout measuring  approximately 3.7 x 1.0 x 0.6 cm. No discrete solid or  cystic nodule identified.    Left lobe: The left lobe is homogeneous in echotexture throughout measuring  approximately 3.4 x 1.2 x 0.9 cm. No discrete solid or cystic nodule identified.    The isthmus measures 0.1 cm.    Vascularity in both lobes of the thyroid is increased.  Impression: 1. No thyroid nodules.    2. Increased vascularity in both lobes of the thyroid suggesting the possibility  of thyroiditis.    Overall TI-RADS score: TR 1    Based on the number of points for a thyroid nodule, the following are the  accepted ACR TI-RADS recommendations:    TR1, Benign = 0 points  TR2, Not suspicious = 2 points  TR3, Mildly suspicious = 3 points (FNA if > 2.5 cm, follow if >1.5 cm)  TR4, Moderately suspicious = 4-6 points (FNA if > 1.5 cm, follow if > 1 cm)  TR5, Highly suspicious = 7+ points (FNA if > 1 cm, follow if > 0.5 cm)         Lab Reports Reviewed (if available): Last 3 months    Hospital Outpatient Visit on 09/04/2022   Component Date Value Ref Range Status    Ferritin 09/04/2022 16  8 - 388 NG/ML Final    Iron 09/04/2022 112  35 - 150 ug/dL Final    Comment: Known Interfering Substances section:  "Iron values may be falsely elevated in  serum samples from patients with  anticoagulants (e.g., hemodialysis patients)."  Limitations of Procedure section:  "Turbidity resulting from precipitation of  fibrinogen in the serum of patients treated  with anticoagulants (e.g. hemodialysis  patients) may cause spuriously elevated  iron results."      TIBC 09/04/2022 385  250 - 450 ug/dL Final    TRANSFERRIN SATURATION 09/04/2022 29  >20 % Final    Sodium 09/04/2022 137  136 - 146 mmol/L  Final    Potassium 09/04/2022 4.1  3.5 - 5.1 mmol/L Final    Chloride 09/04/2022 102 (L)  103 - 113 mmol/L Final    CO2 09/04/2022 29  21 - 32 mmol/L Final    Anion Gap 09/04/2022 6  2 - 11 mmol/L Final    Glucose 09/04/2022 92  65 - 100 mg/dL Final    BUN 96/29/5284 18  6 - 23 MG/DL Final    Creatinine 13/24/4010 0.80  0.6 - 1.0 MG/DL  Final    Est, Glom Filt Rate 09/04/2022 >60  >60 ml/min/1.21m2 Final    Comment:    Pediatric calculator link: https://www.kidney.org/professionals/kdoqi/gfr_calculatorped     These results are not intended for use in patients <83 years of age.     eGFR results are calculated without a race factor using  the 2021 CKD-EPI equation. Careful clinical correlation is recommended, particularly when comparing to results calculated using previous equations.  The CKD-EPI equation is less accurate in patients with extremes of muscle mass, extra-renal metabolism of creatinine, excessive creatine ingestion, or following therapy that affects renal tubular secretion.      Calcium 09/04/2022 9.2  8.3 - 10.4 MG/DL Final    Total Bilirubin 09/04/2022 0.4  0.2 - 1.1 MG/DL Final    ALT 27/25/3664 31  12 - 65 U/L Final    AST 09/04/2022 25  15 - 37 U/L Final    Alk Phosphatase 09/04/2022 48 (L)  50 - 136 U/L Final    Total Protein 09/04/2022 8.0  6.3 - 8.2 g/dL Final    Albumin 40/34/7425 3.9  3.5 - 5.0 g/dL Final    Globulin 95/63/8756 4.1  2.8 - 4.5 g/dL Final    Albumin/Globulin Ratio 09/04/2022 1.0  0.4 - 1.6   Final    WBC 09/04/2022 7.1  4.3 - 11.1 K/uL Final    RBC 09/04/2022 4.82  4.05 - 5.2 M/uL Final    Hemoglobin 09/04/2022 14.5  11.7 - 15.4 g/dL Final    Hematocrit 43/32/9518 43.9  35.8 - 46.3 % Final    MCV 09/04/2022 91.1  82.0 - 102.0 FL Final    MCH 09/04/2022 30.1  26.1 - 32.9 PG Final    MCHC 09/04/2022 33.0  31.4 - 35.0 g/dL Final    RDW 84/16/6063 12.5  11.9 - 14.6 % Final    Platelets 09/04/2022 326  150 - 450 K/uL Final    MPV 09/04/2022 8.9 (L)  9.4 - 12.3 FL Final    nRBC 09/04/2022 0.00  0.0 - 0.2 K/uL Final    **Note: Absolute NRBC parameter is now reported with Hemogram**    Neutrophils % 09/04/2022 67  43 - 78 % Final    Lymphocytes % 09/04/2022 25  13 - 44 % Final    Monocytes % 09/04/2022 7  4.0 - 12.0 % Final    Eosinophils % 09/04/2022 0 (L)  0.5 - 7.8 % Final    Basophils % 09/04/2022 1  0.0 - 2.0 %  Final    Immature Granulocytes % 09/04/2022 0  0.0 - 5.0 % Final    Neutrophils Absolute 09/04/2022 4.7  1.7 - 8.2 K/UL Final    Lymphocytes Absolute 09/04/2022 1.8  0.5 - 4.6 K/UL Final    Monocytes Absolute 09/04/2022 0.5  0.1 - 1.3 K/UL Final    Eosinophils Absolute 09/04/2022 0.0  0.0 - 0.8 K/UL Final    Basophils Absolute 09/04/2022 0.1  0.0 - 0.2 K/UL Final  Immature Granulocytes Absolute 09/04/2022 0.0  0.0 - 0.5 K/UL Final    Differential Type 09/04/2022 AUTOMATED    Final     The results above were reviewed and discussed with patient.       Assessment/Plan:   Pollyanna Levay is a 57 y.o. female who presents with:     Undifferentiated connective tissue disease (HCC): She was instructed to continue hydroxychloroquine 200 mg a pill and a half to be taken once a day after food.  Patient is in the process of contacting the eye doctors office to tell them to fax over the most recent eye note to [place].  While on Plaquenil patient is aware that she will need to keep up with yearly eye exams.    Osteopenia of multiple sites: Patient did receive Prolia shot # 2 on 09/27/2022 she will be due for her next Prolia shot on or after 03/29/2023.  I did instruct her to start calcium 600 mg 1 pill to be taken twice a day while continuing vitamin D 5000 IUs daily.  Since her last DEXA scan was done on 11/17/2021 she will be due for her next DEXA scan on or after 11/18/2023.    Long-term use of high-risk medication: Since patient did have a CBC and a CMP done on 09/04/2022 that was reviewed by me with the patient I did not feel the need to repeat those labs today.     Disease activity plan:  As stated above.    Steroid management plan:  As stated above, if applicable.    Pain management plan:  As stated above, if applicable.    Weight management plan:  Weight loss through diet and exercise is always encouraged    Disease prognosis: Good    I appreciate the opportunity to continue to participate in the care of this patient.      Follow-up and Dispositions    Return in about 4 months (around 02/08/2023).       Electronically signed by:  Danielle Freud, MD      This note was dictated using dragon voice recognition software.  It has been proofread, but there may still exist voice recognition errors that the author did not detect.                --------------------------------------------------------------------------------------------------------------------------------------------------------------------------------------------------------------------------------

## 2022-10-15 ENCOUNTER — Encounter

## 2022-10-31 ENCOUNTER — Encounter: Payer: PRIVATE HEALTH INSURANCE | Primary: Family Medicine

## 2022-10-31 ENCOUNTER — Inpatient Hospital Stay: Admit: 2022-10-31 | Payer: PRIVATE HEALTH INSURANCE | Primary: Family Medicine

## 2022-10-31 DIAGNOSIS — E030 Congenital hypothyroidism with diffuse goiter: Secondary | ICD-10-CM

## 2022-10-31 MED ORDER — SODIUM IODIDE I-123 3.7 MBQ PO CAPS
3.7 | Freq: Once | ORAL | Status: AC
Start: 2022-10-31 — End: 2022-10-31
  Administered 2022-10-31: 12:00:00 577.6 via ORAL

## 2022-11-01 ENCOUNTER — Ambulatory Visit: Payer: PRIVATE HEALTH INSURANCE | Primary: Family Medicine

## 2022-11-09 ENCOUNTER — Encounter: Payer: PRIVATE HEALTH INSURANCE | Attending: Rheumatology | Primary: Family Medicine

## 2022-11-09 NOTE — Progress Notes (Unsigned)
[facility]  Loreen Freud, M.D.  [address], [address]   [place], [id number]  Office : [phone], Fax: [phone]     RHEUMATOLOGY OFFICE VISIT NOTE  Date of Visit:  11/09/2022 7:40 AM    Patient Information:  Name:  Danielle Powell  DOB:  Mar 02, 1966  Age:  57 y.o.   Gender:  female      Danielle Powell is here today for follow-up of undifferentiated connective tissue disease, osteopenia of multiple sites and medication monitoring.          Last visit: 10/09/2022       History of Present Illness:    Since the last visit, patient is feeling "***".       /10  Location:  ***  Quality:  {:30012519}  Modifying Factors:  ***  Associated Symptoms:   ; ***   /10         10/09/2022     8:00 AM   DMARD/Biologic   AM Stiffness 30 min   Pain 4   Fatigue 8   MDHAQ 0   Patient Global Score 5   Medication Name Plaquenil             Last TB screen:years ago  TB result:always negative     Current dose of steroids:none  How long on current dose of steroids:NA  How long on continuous steroid therapy:NA     Past DMARDs, if applicable (methotrexate, plaquenil/hydroxychloroquine, sulfasalazine, Arava/leflunomide):Currently taking Plaquenil 200 mg QD.      Past biologics, if applicable (enbrel, humira, simponi, cimzia, Harriette Ohara, [place], remicade, simponi aria, actemra, rituximab, Henderson Gibsonia, stelara, cosentyx):none     Past NSAIDs, if applicable (motrin, aleve, naproxen, advil, ibuprofen, celebrex, voltaren/diclofenac, etc.):none due to angioedema        Last BMD:11/17/21  Past osteoporosis drugs, if applicable (fosamax, actonel, boniva, reclast, prolia, forteo):Forteo in the past. Prolia #2 given on 09/27/22.     BMI:25.23  Current exercise regimen, if any:gym 4x weekly  Current vitamin D dose: Vit D3- 5000 IU a day and one MV qd  Current calcium dose:none  Fractures since last visit, if any: none      The patient otherwise has no significant interval changes in health or medical history to report.      History Reviewed:    Past Medical History  Past Medical History:   Diagnosis Date    ADD (attention deficit disorder)     ANA positive     Anti-RNP antibodies present     Arthritis     Depression     Endometriosis     Fibromyalgia     Generalized anxiety disorder     Hypertension     Hyperthyroidism     Irritable bowel syndrome     Lupus (HCC)     Mononucleosis     Primary insomnia     Vertigo        Past Surgical History  Past Surgical History:   Procedure Laterality Date    CHOLECYSTECTOMY         Family History  Family History   Problem Relation Age of Onset    Hypertension Mother     Diabetes Mother     Cancer Mother     Heart Disease Father     Mult Sclerosis Sister     Heart Disease Other     Alzheimer's Disease Other        Social History  Social History  Socioeconomic History    Marital status: Married   Tobacco Use    Smoking status: Never    Smokeless tobacco: Never   Substance and Sexual Activity    Alcohol use: Yes     Alcohol/week: 1.0 standard drink of alcohol     Types: 1 Glasses of wine per week    Drug use: Yes     Types: Other     Comment: Takes Ativan and Ambien. Never used street drugs.               Allergy:  Allergies   Allergen Reactions    Baloxavir Marboxil Hallucinations     Other Reaction(s): Hallucinations, Hallucinations-Intolerance    Blue Dyes (Parenteral) Anaphylaxis and Angioedema     Other Reaction(s): Angioedema-Allergy    Ibuprofen Swelling     swelling      Nsaids Swelling     Other Reaction(s): Angioedema-Allergy    Red Dye Anaphylaxis, Angioedema and Hives     Other Reaction(s): Angioedema    Other Swelling     Medication not listed in Epic for Influenza    Other Reaction(s): Joint swelling-Intolerance      Medication not listed in Epic for Influenza    Prochlorperazine Rash     Other Reaction(s): Rash-Allergy    Prochlorperazine Edisylate Rash         Current Medications:  Outpatient Encounter Medications as of 11/09/2022   Medication Sig Dispense Refill    linaclotide  (LINZESS) 290 MCG CAPS capsule Take 1 capsule by mouth daily      amLODIPine (NORVASC) 10 MG tablet Take 1 tablet by mouth daily      hydroxychloroquine (PLAQUENIL) 200 MG tablet Take 1 tablet by mouth daily      EPINEPHrine (EPIPEN) 0.3 MG/0.3ML SOAJ injection epinephrine 0.3 mg/0.3 mL injection, auto-injector   INJECT 0.3 ML (0.3 MG) INTO THE MUSCLE ONCE AS NEEDED FOR ANAPHYLAXIS FOR UP TO 1 DOSE      montelukast sodium (SINGULAIR) 4 MG PACK       fexofenadine (ALLEGRA ALLERGY) 180 MG tablet Take 1 tablet by mouth daily      famotidine (PEPCID) 20 MG tablet Take 1 tablet by mouth daily      LORazepam (ATIVAN) 1 MG tablet Take 1 tablet by mouth 2 times daily as needed.      zolpidem (AMBIEN) 5 MG tablet zolpidem 5 mg tablet   TAKE 1 TABLET BY MOUTH NIGHTLY      Omega-3 Fatty Acids (FISH OIL) 1000 MG capsule 1 capsule      Magnesium 300 MG CAPS 1 capsule with a meal Orally Once a day for 30 day(s)      cycloSPORINE (RESTASIS) 0.05 % ophthalmic emulsion Restasis 0.05 % eye drops in a dropperette   INSTILL 1 DROP INTO BOTH EYES TWICE A DAY      estrogens conjugated (PREMARIN) 0.625 MG/GM CREA vaginal cream Premarin 0.625 mg/gram vaginal cream   INSERT 0.5 APPLICATORSFUL EVERY DAY BY VAGINAL ROUTE FOR 30 DAYS.       No facility-administered encounter medications on file as of 11/09/2022.           REVIEW OF SYSTEMS: The following systems were reviewed with patient today and were negative except for the following (depicted with an "X"):        "X" General  "X" Head and Neck  "X" Heart and Breathing  "X" Gastrointestinal    Fever/chills   Hair loss   Shortness of breath  Upset stomach    Falls   Dry mouth   Coughing   Diarrhea / constipation    Wt loss   Mouth sores   Wheezing   Heartburn    Wt gain   Ringing ears   Chest pain   Dark or bloody stools    Night sweats   Diff. swallowing  X None of above   Nausea or vomiting   X None of above  X None of above     X None of above                "X" Skin  "X" Neurology  "X"  Urinary/Gyn  "X" Other    Easy bruising   Numbness/ tingling   Female problems   Depression    Rashes   Weakness   Problems with urination   Feeling anxious    Sun sensitivity   Headaches  X None of above   Problems sleeping   X None of above  X None of above     X None of above          Physical Exam:  There were no vitals taken for this visit.    General:  Patient alert, cooperative and in no apparent distress.  HEENT:  Heart:  Lungs:   Abdomen:  Skin:  No rashes. No nail abnormalities.  Neurologic:  Oriented, normal speech and affect.  Normal gait.    Extremities:  No edema in bilateral lower extremities.    Muskoskeletal Exam:     I examined the shoulders, elbows, wrists, MCPs, PIPs, DIPs and knees bilaterally for strength, range of motion, deformity, tenderness, swelling, and synovitis.  The findings are:  No joint tenderness or synovitis.         Physical Exam        There is currently no information documented on the homunculus. Go to the Rheumatology activity and complete the homunculus joint exam.      Joint Exam 11/09/2022     No joint exam has been documented for this visit           cJADAS 10: --     Patient otherwise has a normal joint exam without other evidence of joint tenderness, synovitis, warmth, erythema, decreased ROM, weakness or deformities.         Radiology Reports Reviewed (if available):  Last 3 months     NM THYROID UPTAKE AND SCAN  Narrative: Thyroid Uptake and Scan    INDICATION: Throat swelling and neck pain. Thyroid labs all normal.    An uptake study was performed after oral ingestion of 577 uCi of I-123.  Uptake  is 6.8% at 4 hours and 13.5% at 24 hours.  Both values are within normal limits.    A thyroid scan was then performed.  Both lobes of the thyroid gland have a  normal, homogeneous appearance.  There are no foci of increased activity or  photopenia.  Impression: Unremarkable thyroid uptake and scan         Lab Reports Reviewed (if available): Last 3 months    Hospital  Outpatient Visit on 09/04/2022   Component Date Value Ref Range Status    Ferritin 09/04/2022 16  8 - 388 NG/ML Final    Iron 09/04/2022 112  35 - 150 ug/dL Final    Comment: Known Interfering Substances section:  "Iron values may be falsely elevated in  serum samples from patients with  anticoagulants (e.g., hemodialysis patients)."  Limitations of Procedure section:  "Turbidity resulting from precipitation of  fibrinogen in the serum of patients treated  with anticoagulants (e.g. hemodialysis  patients) may cause spuriously elevated  iron results."      TIBC 09/04/2022 385  250 - 450 ug/dL Final    TRANSFERRIN SATURATION 09/04/2022 29  >20 % Final    Sodium 09/04/2022 137  136 - 146 mmol/L Final    Potassium 09/04/2022 4.1  3.5 - 5.1 mmol/L Final    Chloride 09/04/2022 102 (L)  103 - 113 mmol/L Final    CO2 09/04/2022 29  21 - 32 mmol/L Final    Anion Gap 09/04/2022 6  2 - 11 mmol/L Final    Glucose 09/04/2022 92  65 - 100 mg/dL Final    BUN 16/03/9603 18  6 - 23 MG/DL Final    Creatinine 54/02/8118 0.80  0.6 - 1.0 MG/DL Final    Est, Glom Filt Rate 09/04/2022 >60  >60 ml/min/1.25m2 Final    Comment:    Pediatric calculator link: https://www.kidney.org/professionals/kdoqi/gfr_calculatorped     These results are not intended for use in patients <35 years of age.     eGFR results are calculated without a race factor using  the 2021 CKD-EPI equation. Careful clinical correlation is recommended, particularly when comparing to results calculated using previous equations.  The CKD-EPI equation is less accurate in patients with extremes of muscle mass, extra-renal metabolism of creatinine, excessive creatine ingestion, or following therapy that affects renal tubular secretion.      Calcium 09/04/2022 9.2  8.3 - 10.4 MG/DL Final    Total Bilirubin 09/04/2022 0.4  0.2 - 1.1 MG/DL Final    ALT 14/78/2956 31  12 - 65 U/L Final    AST 09/04/2022 25  15 - 37 U/L Final    Alk Phosphatase 09/04/2022 48 (L)  50 - 136 U/L Final     Total Protein 09/04/2022 8.0  6.3 - 8.2 g/dL Final    Albumin 21/30/8657 3.9  3.5 - 5.0 g/dL Final    Globulin 84/69/6295 4.1  2.8 - 4.5 g/dL Final    Albumin/Globulin Ratio 09/04/2022 1.0  0.4 - 1.6   Final    WBC 09/04/2022 7.1  4.3 - 11.1 K/uL Final    RBC 09/04/2022 4.82  4.05 - 5.2 M/uL Final    Hemoglobin 09/04/2022 14.5  11.7 - 15.4 g/dL Final    Hematocrit 28/41/3244 43.9  35.8 - 46.3 % Final    MCV 09/04/2022 91.1  82.0 - 102.0 FL Final    MCH 09/04/2022 30.1  26.1 - 32.9 PG Final    MCHC 09/04/2022 33.0  31.4 - 35.0 g/dL Final    RDW 06/27/7251 12.5  11.9 - 14.6 % Final    Platelets 09/04/2022 326  150 - 450 K/uL Final    MPV 09/04/2022 8.9 (L)  9.4 - 12.3 FL Final    nRBC 09/04/2022 0.00  0.0 - 0.2 K/uL Final    **Note: Absolute NRBC parameter is now reported with Hemogram**    Neutrophils % 09/04/2022 67  43 - 78 % Final    Lymphocytes % 09/04/2022 25  13 - 44 % Final    Monocytes % 09/04/2022 7  4.0 - 12.0 % Final    Eosinophils % 09/04/2022 0 (L)  0.5 - 7.8 % Final    Basophils % 09/04/2022 1  0.0 - 2.0 % Final    Immature Granulocytes % 09/04/2022 0  0.0 - 5.0 % Final  Neutrophils Absolute 09/04/2022 4.7  1.7 - 8.2 K/UL Final    Lymphocytes Absolute 09/04/2022 1.8  0.5 - 4.6 K/UL Final    Monocytes Absolute 09/04/2022 0.5  0.1 - 1.3 K/UL Final    Eosinophils Absolute 09/04/2022 0.0  0.0 - 0.8 K/UL Final    Basophils Absolute 09/04/2022 0.1  0.0 - 0.2 K/UL Final    Immature Granulocytes Absolute 09/04/2022 0.0  0.0 - 0.5 K/UL Final    Differential Type 09/04/2022 AUTOMATED    Final         The results above were reviewed and discussed with patient.         Assessment:   Danielle Powell is a 57 y.o. female who presents with:       {ASSESSMENT/PLAN:19072}      ***    There are no Patient Instructions on file for this visit.        Disease activity plan:  As stated above.    Steroid management plan:  As stated above, if applicable.    Pain management plan:  As stated above, if applicable.    Weight  management plan:  Weight loss through diet and exercise is always encouraged    Disease prognosis: Good        I appreciate the opportunity to continue to participate in the care of this patient.       Electronically signed by:  Loreen Freud, MD      This note was dictated using dragon voice recognition software.  It has been proofread, but there may still exist voice recognition errors that the author did not detect.                --------------------------------------------------------------------------------------------------------------------------------------------------------------------------------------------------------------------------------

## 2022-11-12 ENCOUNTER — Encounter: Payer: PRIVATE HEALTH INSURANCE | Attending: Rheumatology | Primary: Family Medicine

## 2022-11-27 ENCOUNTER — Encounter: Payer: PRIVATE HEALTH INSURANCE | Attending: Rheumatology | Primary: Family Medicine

## 2022-12-06 ENCOUNTER — Encounter: Payer: PRIVATE HEALTH INSURANCE | Attending: Rheumatology | Primary: Family Medicine

## 2023-01-04 ENCOUNTER — Encounter: Payer: PRIVATE HEALTH INSURANCE | Attending: Family | Primary: Family Medicine

## 2023-01-04 ENCOUNTER — Encounter: Payer: PRIVATE HEALTH INSURANCE | Primary: Family Medicine

## 2023-01-04 ENCOUNTER — Ambulatory Visit
Admit: 2023-01-04 | Discharge: 2023-01-04 | Payer: PRIVATE HEALTH INSURANCE | Attending: Rheumatology | Primary: Family Medicine

## 2023-01-04 ENCOUNTER — Inpatient Hospital Stay: Admit: 2023-01-04 | Payer: PRIVATE HEALTH INSURANCE | Primary: Family Medicine

## 2023-01-04 ENCOUNTER — Encounter

## 2023-01-04 DIAGNOSIS — G8929 Other chronic pain: Secondary | ICD-10-CM

## 2023-01-04 DIAGNOSIS — M545 Low back pain, unspecified: Secondary | ICD-10-CM

## 2023-01-04 DIAGNOSIS — M359 Systemic involvement of connective tissue, unspecified: Secondary | ICD-10-CM

## 2023-01-04 LAB — SEDIMENTATION RATE: Sed Rate, Automated: 3 mm/hr (ref 0–30)

## 2023-01-04 MED ORDER — HYDROXYCHLOROQUINE SULFATE 200 MG PO TABS
200 MG | ORAL_TABLET | ORAL | 0 refills | Status: AC
Start: 2023-01-04 — End: 2023-02-12

## 2023-01-04 MED ORDER — PREDNISONE 5 MG PO TABS
5 MG | ORAL_TABLET | ORAL | 2 refills | Status: AC
Start: 2023-01-04 — End: 2023-02-12

## 2023-01-04 NOTE — Progress Notes (Signed)
[facility]  Loreen Freud, M.D.  [address], [address]   [place], [id number]  Office : [phone], Fax: [phone]     RHEUMATOLOGY OFFICE VISIT NOTE  Date of Visit:  01/04/2023 8:41 AM    Patient Information:  Name:  Danielle Powell  DOB:  1966-02-02  Age:  57 y.o.   Gender:  female      Ms. Radermacher is here for follow-up of undifferentiated connective tissue disease, osteopenia and medication monitoring.     Last visit: 10/09/22    History of Present Illness: On talking to the patient today she states that she is having more pain at night in her joints and it is waking her up from sleep.  She was recently checked for EBV and it did indicate that she had been previously exposed to EBV which could explain her recent onset of worsening joint pain.  With regard to her most recent eye exam she states that it was done in June of 2024 at [facility]. She is going to call them and have them fax [place] the last office note.  Currently she does complain of having swollen glands involving her neck which is persistent with a small rash on her chest, back and scalp.  She has also had some difficulty swallowing as well.  Patient's current joint complaints are as mentioned below.    Since the last visit, patient is feeling "fair".      Pain: 6/10  Location:  Bilateral ankle swelling with some pain with no warmth and redness. Some right buttock pain with some lower back pain. Some neck stiffness with no pain with neck ROM. No para spinal muscle pain. Bilateral shoulder pain.  Some right hip and groin pain. Some pain more in the MCP than the PIP joints of both the hands with swelling of the right index finger MCP and DIP joints. Some pain and swelling of her feet. Occasional tension headaches.   Quality: Deep achy in nature.  Modifying Factors:  Tylenol arthritis helps with pain relief.   Associated Symptoms:  Intermittent pain from the right shoulder to the right elbow with no  tingling and numbness down the right arm with intermittent tingling and numbness of the hands and feet.         01/04/2023     8:00 AM   DMARD/Biologic   AM Stiffness 30 min   Pain 6   Fatigue 6   MDHAQ 0   Patient Global Score 5   Medication Name Plaquenil     Last TB screen: Years ago  TB result: Always been negative     Current dose of steroids:none  How long on current dose of steroids:NA  How long on continuous steroid therapy:NA     Past DMARDs, if applicable (methotrexate, plaquenil/hydroxychloroquine, sulfasalazine, Arava/leflunomide): Currently taking Plaquenil 200 mg QD.      Past biologics, if applicable (enbrel, humira, simponi, cimzia, Harriette Ohara, Centrahoma, remicade, simponi aria, actemra, rituximab, Henderson Sorento, stelara, cosentyx):none     Past NSAIDs, if applicable (motrin, aleve, naproxen, advil, ibuprofen, celebrex, voltaren/diclofenac, etc.):none due to angioedema     Last BMD:11/17/21  Past osteoporosis drugs, if applicable (fosamax, actonel, boniva, reclast, prolia, forteo):Forteo in the past.  Currently on Prolia #2 given on 09/27/22.     BMI:26.29  Current exercise regimen, if any:gym 4x weekly  Current vitamin D dose: Vit D3- 5000 IU a day and one MV qd  Current calcium dose: MV qd  Fractures  since last visit, if any: none      The patient otherwise has no significant interval changes in health or medical history to report.     History Reviewed:    Past Medical History  Past Medical History:   Diagnosis Date    ADD (attention deficit disorder)     ANA positive     Anti-RNP antibodies present     Arthritis     Depression     Endometriosis     Fibromyalgia     Generalized anxiety disorder     Hypertension     Hyperthyroidism     Irritable bowel syndrome     Lupus (HCC)     Mononucleosis     Primary insomnia     Vertigo        Past Surgical History  Past Surgical History:   Procedure Laterality Date    CHOLECYSTECTOMY         Family History  Family History   Problem Relation Age of Onset    Hypertension  Mother     Diabetes Mother     Cancer Mother     Heart Disease Father     Mult Sclerosis Sister     Heart Disease Other     Alzheimer's Disease Other        Social History  Social History     Socioeconomic History    Marital status: Married     Spouse name: None    Number of children: None    Years of education: None    Highest education level: None   Tobacco Use    Smoking status: Never    Smokeless tobacco: Never   Substance and Sexual Activity    Alcohol use: Yes     Alcohol/week: 1.0 standard drink of alcohol     Types: 1 Glasses of wine per week    Drug use: Yes     Types: Other     Comment: Takes Ativan and Ambien. Never used street drugs.               Allergy:  Allergies   Allergen Reactions    Baloxavir Marboxil Hallucinations     Other Reaction(s): Hallucinations, Hallucinations-Intolerance    Blue Dyes (Parenteral) Anaphylaxis and Angioedema     Other Reaction(s): Angioedema-Allergy    Ibuprofen Swelling     swelling      Nsaids Swelling     Other Reaction(s): Angioedema-Allergy    Red Dye Anaphylaxis, Angioedema and Hives     Other Reaction(s): Angioedema    Other Swelling     Medication not listed in Epic for Influenza    Other Reaction(s): Joint swelling-Intolerance      Medication not listed in Epic for Influenza    Prochlorperazine Rash     Other Reaction(s): Rash-Allergy    Prochlorperazine Edisylate Rash         Current Medications:  Outpatient Encounter Medications as of 01/04/2023   Medication Sig Dispense Refill    Denosumab (PROLIA SC) Inject into the skin #2 given on 09/27/22.      Multiple Vitamins-Minerals (CENTRUM ADULT PO) Take by mouth      Cholecalciferol (VITAMIN D3) 125 MCG (5000 UT) TABS Take 1 tablet by mouth daily      melatonin 3 MG TABS tablet Take 1 tablet by mouth daily      hydroxychloroquine (PLAQUENIL) 200 MG tablet Take 1 and 1/2 pill once a day after food. 135 tablet 0  predniSONE (DELTASONE) 5 MG tablet Take 1 pill once a day after breakfast. 30 tablet 2    linaclotide  (LINZESS) 290 MCG CAPS capsule Take 1 capsule by mouth daily      amLODIPine (NORVASC) 10 MG tablet Take 1 tablet by mouth daily      EPINEPHrine (EPIPEN) 0.3 MG/0.3ML SOAJ injection epinephrine 0.3 mg/0.3 mL injection, auto-injector   INJECT 0.3 ML (0.3 MG) INTO THE MUSCLE ONCE AS NEEDED FOR ANAPHYLAXIS FOR UP TO 1 DOSE      montelukast sodium (SINGULAIR) 4 MG PACK       fexofenadine (ALLEGRA ALLERGY) 180 MG tablet Take 1 tablet by mouth daily      famotidine (PEPCID) 20 MG tablet Take 1 tablet by mouth daily      LORazepam (ATIVAN) 1 MG tablet Take 1 tablet by mouth 2 times daily as needed.      zolpidem (AMBIEN) 5 MG tablet zolpidem 5 mg tablet   TAKE 1 TABLET BY MOUTH NIGHTLY      Magnesium 300 MG CAPS 1 capsule with a meal Orally Once a day for 30 day(s)      cycloSPORINE (RESTASIS) 0.05 % ophthalmic emulsion Restasis 0.05 % eye drops in a dropperette   INSTILL 1 DROP INTO BOTH EYES TWICE A DAY      estrogens conjugated (PREMARIN) 0.625 MG/GM CREA vaginal cream Premarin 0.625 mg/gram vaginal cream   INSERT 0.5 APPLICATORSFUL EVERY DAY BY VAGINAL ROUTE FOR 30 DAYS.      Omega-3 Fatty Acids (FISH OIL) 1000 MG capsule 1 capsule (Patient not taking: Reported on 01/04/2023)      [DISCONTINUED] hydroxychloroquine (PLAQUENIL) 200 MG tablet Take 1 tablet by mouth daily       No facility-administered encounter medications on file as of 01/04/2023.           REVIEW OF SYSTEMS: The following systems were reviewed with patient today and were negative except for the following (depicted with an "X"):        "X" General  "X" Head and Neck  "X" Heart and Breathing  "X" Gastrointestinal    Fever/chills   Hair loss   Shortness of breath   Upset stomach   x Falls   Dry mouth   Coughing  x Diarrhea / constipation    Wt loss   Mouth sores   Wheezing   Heartburn   x Wt gain   Ringing ears   Chest pain   Dark or bloody stools    Night sweats  x Diff. swallowing  X None of above   Nausea or vomiting    None of above   None of above       None of above                "X" Skin  "X" Neurology  "X" Urinary/Gyn  "X" Other    Easy bruising  x Numbness/ tingling   Female problems   Depression   x Rashes   Weakness   Problems with urination   Feeling anxious    Sun sensitivity   Headaches  X None of above  x Problems sleeping    None of above   None of above      None of above     Physical Exam:  Blood pressure (!) 134/91, pulse 62, height 1.524 m (5'), weight 61.1 kg (134 lb 9.6 oz).  General:  Patient alert, cooperative and in no apparent distress.  HEENT: Pupils equally  reactive to light and accommodation, no scleral injection noted.  Neck supple, no lymphadenopathy, no thyromegaly.  Heart: Regular rate and rhythm, normal S1 and S2, no rubs or gallops.  Lungs: Clear to auscultation bilaterally.  Abdomen: Soft, nontender, no hepatosplenomegaly.  Skin:  No rashes. No nail abnormalities.  Neurologic:  Oriented, normal speech and affect.  Normal gait.    Extremities:  No edema in bilateral lower extremities with no cyanosis or clubbing.    Muskoskeletal Exam:     I examined the shoulders, elbows, wrists, MCPs, PIPs, DIPs and knees bilaterally for strength, range of motion, deformity, tenderness, swelling, and synovitis.      The findings are:         Physical Exam              Joint Exam 01/04/2023        Right  Left   Glenohumeral   Tender       Comments  Tenderness anteriorly but not superiorly with intact range of motion to abduction as well as internal rotation.     Wrist   Tender   Tender   PIP 2   Tender   Tender   PIP 3   Tender   Tender   PIP 4   Tender   Tender   PIP 5   Tender   Tender   Cervical Spine   Tender      Lumbar Spine   Tender      Knee   Tender   Tender   Ankle   Tender   Tender   Tarsometatarsal   Tender   Tender     Patient otherwise has a normal joint exam without other evidence of joint tenderness, synovitis, warmth, erythema, decreased ROM, weakness or deformities.     Radiology Reports Reviewed (if available):  Last 3 months      XR LUMBAR SPINE (2-3 VIEWS)  Narrative: EXAM: XR LUMBAR SPINE (2-3 VIEWS)  INDICATION: Low back pain, unspecified; Other chronic pain  COMPARISON: None.    TECHNIQUE: Lumbar Spine AP/LAT imaging was obtained.     FINDINGS:  Straightening of the normal lumbar lordosis. Vertebral body heights are  preserved. No radiographic evidence of fracture. Mild discogenic degenerative  change of the visualized spine. Bone mineralization and soft tissues are within  normal limits. Cholecystectomy clips.  Impression: Mild spondylosis.    Electronically signed by Lavell Luster     Lab Reports Reviewed (if available): Last 3 months    Orders Only on 01/04/2023   Component Date Value Ref Range Status    Sed Rate, Automated 01/04/2023 3  0 - 30 mm/hr Final     The results above were reviewed and discussed with patient.       Assessment/Plan:   Shuntia Copas is a 57 y.o. female who presents with:     Undifferentiated connective tissue disease (HCC): Patient was instructed to continue hydroxychloroquine 200 mg 1 and 1/2 pills to be taken once a day after food.  We are in the process of obtaining the eye note from June 2024.  While on Plaquenil patient is aware that she will need to keep up with yearly eye exams.  With regard to her difficulty swallowing and her allergy to the red dyes in Magic mouthwash, I did not send a prescription for that yet.  If she is continuing to have difficulty swallowing I do plan on setting her up for a swallow study in the near future.   -  hydroxychloroquine (PLAQUENIL) 200 MG tablet; Take 1 and 1/2 pill once a day after food.  -     Sedimentation Rate; Future  -     C-Reactive Protein; Future    Chronic low back pain without sciatica, unspecified back pain laterality: With regard to her lower back pain I did put in an order for her to have a L-spine x-ray done.  I do plan on reviewing the L-spine x-ray results with her on her follow-up visit with me.  -     XR LUMBAR SPINE (2-3 VIEWS); Future  -      predniSONE (DELTASONE) 5 MG tablet; Take 1 pill once a day after breakfast.    Right hip pain: With regard to the right hip pain that she is complaining of I did start her on prednisone 5 mg 1 pill to be taken once a day after breakfast until her follow-up visit with me.  While on Celebrex patient is aware that she will need to avoid any over-the-counter NSAID's such as Advil or Aleve.  -     XR HIP RIGHT (2-3 VIEWS); Future  -     predniSONE (DELTASONE) 5 MG tablet; Take 1 pill once a day after breakfast.    Osteopenia of multiple sites: Patient did receive her Prolia shot # 2 on 09/27/2022 and she will be due for her next Prolia shot on or after 03/29/2023.  I did instruct her to start calcium 600 mg 1 pill to be taken twice a day while continuing vitamin D 5000 IUs daily.  Since her last DEXA scan was done on 11/17/2021 she will be due for her next DEXA scan on or after 11/18/2023.     Long-term use of high-risk medication: Since patient did have a CBC and a CMP done 12/10/2022 that was reviewed by me with the patient I did not feel the need to repeat the CBC and CMP today.    Disease activity plan:  As stated above.    Steroid management plan:  As stated above, if applicable.    Pain management plan:  As stated above, if applicable.    Weight management plan:  Weight loss through diet and exercise is always encouraged    Disease prognosis: Good    I appreciate the opportunity to continue to participate in the care of this patient.     Follow-up and Dispositions    Return in about 6 weeks (around 02/15/2023).       Electronically signed by:  Loreen Freud, MD      This note was dictated using dragon voice recognition software.  It has been proofread, but there may still exist voice recognition errors that the author did not  detect.                --------------------------------------------------------------------------------------------------------------------------------------------------------------------------------------------------------------------------------

## 2023-01-06 LAB — C-REACTIVE PROTEIN: CRP: <1 mg/L (ref 0–10)

## 2023-01-07 NOTE — Telephone Encounter (Signed)
Called pt lvm for her to check my chart for her x ray results and to respond with any further questions or concerns

## 2023-01-07 NOTE — Telephone Encounter (Signed)
-----   Message from Susy Manor, MD sent at 01/07/2023  9:36 AM EDT -----  Please let the patient know that her hip x-ray was normal but her lumbar spine x-ray did show presence of mild osteoarthritis which could be causing the referred hip pain and definitely causing the lower back pain.

## 2023-02-04 ENCOUNTER — Encounter: Admit: 2023-02-04 | Payer: PRIVATE HEALTH INSURANCE | Attending: Family | Primary: Family Medicine

## 2023-02-04 ENCOUNTER — Inpatient Hospital Stay: Admit: 2023-02-04 | Payer: PRIVATE HEALTH INSURANCE | Primary: Family Medicine

## 2023-02-04 DIAGNOSIS — D509 Iron deficiency anemia, unspecified: Secondary | ICD-10-CM

## 2023-02-04 DIAGNOSIS — D649 Anemia, unspecified: Secondary | ICD-10-CM

## 2023-02-04 LAB — CBC WITH AUTO DIFFERENTIAL
Basophils %: 1 % (ref 0.0–2.0)
Basophils Absolute: 0 10*3/uL (ref 0.0–0.2)
Eosinophils %: 0 % — ABNORMAL LOW (ref 0.5–7.8)
Eosinophils Absolute: 0 10*3/uL (ref 0.0–0.8)
Hematocrit: 43.7 % (ref 35.8–46.3)
Hemoglobin: 14.6 g/dL (ref 11.7–15.4)
Immature Granulocytes %: 0 % (ref 0.0–5.0)
Immature Granulocytes Absolute: 0 10*3/uL (ref 0.0–0.5)
Lymphocytes %: 27 % (ref 13–44)
Lymphocytes Absolute: 1.5 10*3/uL (ref 0.5–4.6)
MCH: 29.9 PG (ref 26.1–32.9)
MCHC: 33.4 g/dL (ref 31.4–35.0)
MCV: 89.4 FL (ref 82.0–102.0)
MPV: 9.1 FL — ABNORMAL LOW (ref 9.4–12.3)
Monocytes %: 6 % (ref 4.0–12.0)
Monocytes Absolute: 0.3 10*3/uL (ref 0.1–1.3)
Neutrophils %: 66 % (ref 43–78)
Neutrophils Absolute: 3.8 10*3/uL (ref 1.7–8.2)
Platelets: 298 10*3/uL (ref 150–450)
RBC: 4.89 M/uL (ref 4.05–5.2)
RDW: 12.1 % (ref 11.9–14.6)
WBC: 5.8 10*3/uL (ref 4.3–11.1)
nRBC: 0 10*3/uL (ref 0.0–0.2)

## 2023-02-04 LAB — IRON AND TIBC
Iron % Saturation: 15 % — ABNORMAL LOW (ref 20–50)
Iron: 61 ug/dL (ref 35–100)
TIBC: 416 ug/dL (ref 240–450)
UIBC: 355 ug/dL — ABNORMAL HIGH (ref 112.0–347.0)

## 2023-02-04 LAB — FERRITIN: Ferritin: 32 NG/ML (ref 8–388)

## 2023-02-04 LAB — COMPREHENSIVE METABOLIC PANEL
ALT: 31 U/L (ref 12–65)
AST: 34 U/L (ref 15–37)
Albumin/Globulin Ratio: 1.2 (ref 1.0–1.9)
Albumin: 4.3 g/dL (ref 3.5–5.0)
Alk Phosphatase: 55 U/L (ref 35–104)
Anion Gap: 12 mmol/L (ref 9–18)
BUN: 15 MG/DL (ref 6–23)
CO2: 26 mmol/L (ref 20–28)
Calcium: 9.4 MG/DL (ref 8.8–10.2)
Chloride: 98 mmol/L (ref 98–107)
Creatinine: 0.81 MG/DL (ref 0.60–1.10)
Est, Glom Filt Rate: 85 mL/min/{1.73_m2} (ref >60)
Globulin: 3.5 g/dL (ref 2.3–3.5)
Glucose: 93 mg/dL (ref 70–99)
Potassium: 4.5 mmol/L (ref 3.5–5.1)
Sodium: 136 mmol/L (ref 136–145)
Total Bilirubin: 0.3 MG/DL (ref 0.0–1.2)
Total Protein: 7.8 g/dL (ref 6.3–8.2)

## 2023-02-04 NOTE — Progress Notes (Signed)
[facility]: Office Visit Established Patient H & P    Chief Complaint:    Chief Complaint   Patient presents with    Follow-up         History of Present Illness:  [name] is a 57 y.o. female who presents today for evaluation regarding iron deficiency anemia.  She has been previously followed at [facility] hematology, she is receiving intermittent iron infusions for IDA, most recently in July 2023.  She saw them in November 2023 and her iron stores were improving so no treatment was given, she has been intolerant to oral iron historically.  Of note she has a history of connective tissue disease and is followed by rheumatology.  Most recent labs at primary care showed a Hgb of 15.1 with no evidence of microcytosis, iron studies were not done at that time.  She now presents to [facility] for evaluation as she has [employer] and the ongoing network dispute with [facility] her OON for her previous hematologist.       She is here today for follow up for IDA.  Since last seen she has been well.  Fatigue is ongoing, uses caffeine to get through her day.  She denies any bleeding, shortness of breath, ice cravings, or PICA.  There have been no recent illness or infectious symptoms.      Review of Systems:  Constitutional: Positive for fatigue.   HENT: Negative.   Eyes: Negative.   Respiratory: Negative.   Cardiovascular: Negative.   Gastrointestinal: Negative.   Genitourinary: Negative.   Musculoskeletal: Negative.   Skin: Negative.   Neurological: Negative.   Endo/Heme/Allergies: Negative.   Psychiatric/Behavioral: Negative.   All other systems reviewed and are negative.     Allergies   Allergen Reactions    Baloxavir Marboxil Hallucinations     Other Reaction(s): Hallucinations, Hallucinations-Intolerance    Blue Dyes (Parenteral) Anaphylaxis and Angioedema     Other Reaction(s): Angioedema-Allergy    Ibuprofen Swelling     swelling      Nsaids Swelling     Other Reaction(s): Angioedema-Allergy     Red Dye Anaphylaxis, Angioedema and Hives     Other Reaction(s): Angioedema    Other Swelling     Medication not listed in Epic for Influenza    Other Reaction(s): Joint swelling-Intolerance      Medication not listed in Epic for Influenza    Prochlorperazine Rash     Other Reaction(s): Rash-Allergy    Prochlorperazine Edisylate Rash     Past Medical History:   Diagnosis Date    ADD (attention deficit disorder)     ANA positive     Anti-RNP antibodies present     Arthritis     Depression     Endometriosis     Fibromyalgia     Generalized anxiety disorder     Hypertension     Hyperthyroidism     Irritable bowel syndrome     Lupus (HCC)     Mononucleosis     Primary insomnia     Vertigo      Past Surgical History:   Procedure Laterality Date    CHOLECYSTECTOMY       Family History   Problem Relation Age of Onset    Hypertension Mother     Diabetes Mother     Cancer Mother     Heart Disease Father     Mult Sclerosis Sister     Heart Disease Other  Alzheimer's Disease Other      Social History     Socioeconomic History    Marital status: Married     Spouse name: Not on file    Number of children: Not on file    Years of education: Not on file    Highest education level: Not on file   Occupational History    Not on file   Tobacco Use    Smoking status: Never     Passive exposure: Never    Smokeless tobacco: Never   Substance and Sexual Activity    Alcohol use: Yes     Alcohol/week: 1.0 standard drink of alcohol     Types: 1 Glasses of wine per week    Drug use: Yes     Types: Other     Comment: Takes Ativan and Ambien. Never used street drugs.    Sexual activity: Not on file   Other Topics Concern    Not on file   Social History Narrative    Not on file     Social Determinants of Health     Financial Resource Strain: Low Risk  (12/04/2022)    Received from [facility], [facility]    Financial Resource Strain     Difficulty Paying Living Expenses: Not hard at all     Difficulty Paying Medical Expenses: No   Food  Insecurity: No Food Insecurity (12/04/2022)    Received from [facility], [facility]    Food Insecurity     Worried about Running Out of Food in the Last Year: Never true     Ran Out of Food in the Last Year: Never true   Transportation Needs: No Transportation Needs (12/04/2022)    [facility] from  [facility]    Transportation Needs     Lack of Transportation: No   Physical Activity: Sufficiently Active (12/04/2022)    Received from [facility], [facility]    Physical Activity     Days of Exercise per Week: 5     Minutes of Exercise per Session: 60     Total Minutes of Exercise per Week: 300   Stress: No Stress Concern Present (12/04/2022)    Received from [facility], [facility]    Stress     Feeling of Stress : Only a little   Social Connections: Moderately Integrated (12/04/2022)    Received from [facility], [facility]    Social Connections     Frequency of Communication with Friends and Family: More than three times a week     Frequency of Social Gatherings with Friends and Family: Once a week   Intimate Partner Violence: Unknown (12/01/2021)    Received from [facility], [facility]    Intimate Partner Violence     Fear of Current or Ex-Partner: Not asked     Emotionally Abused: Not asked     Physically Abused: Not asked     Sexually Abused: Not asked   Housing Stability: Not At Risk (12/04/2022)    Received from [facility], [facility]    Housing Stability     Was there a time when you did not have a steady place to sleep: No     Worried that the place you are staying is making you sick: No     Current Outpatient Medications   Medication Sig Dispense Refill    Denosumab (PROLIA SC) Inject into the skin #2 given on 09/27/22.  Multiple Vitamins-Minerals (CENTRUM ADULT PO) Take by mouth      Cholecalciferol (VITAMIN D3) 125 MCG (5000 UT) TABS Take 1 tablet by mouth daily      melatonin 3 MG TABS tablet Take 1 tablet by mouth daily      hydroxychloroquine (PLAQUENIL) 200 MG  tablet Take 1 and 1/2 pill once a day after food. 135 tablet 0    linaclotide (LINZESS) 290 MCG CAPS capsule Take 1 capsule by mouth daily      amLODIPine (NORVASC) 10 MG tablet Take 1 tablet by mouth daily      EPINEPHrine (EPIPEN) 0.3 MG/0.3ML SOAJ injection epinephrine 0.3 mg/0.3 mL injection, auto-injector   INJECT 0.3 ML (0.3 MG) INTO THE MUSCLE ONCE AS NEEDED FOR ANAPHYLAXIS FOR UP TO 1 DOSE      montelukast sodium (SINGULAIR) 4 MG PACK       fexofenadine (ALLEGRA ALLERGY) 180 MG tablet Take 1 tablet by mouth daily      famotidine (PEPCID) 20 MG tablet Take 1 tablet by mouth daily      LORazepam (ATIVAN) 1 MG tablet Take 1 tablet by mouth 2 times daily as needed.      zolpidem (AMBIEN) 5 MG tablet zolpidem 5 mg tablet   TAKE 1 TABLET BY MOUTH NIGHTLY      Magnesium 300 MG CAPS 1 capsule with a meal Orally Once a day for 30 day(s)      cycloSPORINE (RESTASIS) 0.05 % ophthalmic emulsion Restasis 0.05 % eye drops in a dropperette   INSTILL 1 DROP INTO BOTH EYES TWICE A DAY      estrogens conjugated (PREMARIN) 0.625 MG/GM CREA vaginal cream Premarin 0.625 mg/gram vaginal cream   INSERT 0.5 APPLICATORSFUL EVERY DAY BY VAGINAL ROUTE FOR 30 DAYS.      predniSONE (DELTASONE) 5 MG tablet Take 1 pill once a day after breakfast. (Patient not taking: Reported on 02/04/2023) 30 tablet 2    Omega-3 Fatty Acids (FISH OIL) 1000 MG capsule 1 capsule (Patient not taking: Reported on 01/04/2023)       No current facility-administered medications for this visit.       OBJECTIVE:  BP (!) 142/88   Pulse 76   Temp 98.8 F (37.1 C) (Oral)   Resp 22   Ht 1.524 m (5')   Wt 61.8 kg (136 lb 3.2 oz)   SpO2 96%   BMI 26.60 kg/m   Pain Score:   0 - No pain (fatigue-5)     Physical Exam:  Constitutional: Well developed, well nourished female in no acute distress, sitting comfortably on the examination table.    HEENT: Normocephalic and atraumatic. Sclerae anicteric. Neck supple without JVD. No thyromegaly present.    Skin Warm and  dry.  No bruising and no rash noted.  No erythema.  No pallor.    Respiratory Lungs are clear to auscultation bilaterally without wheezes, rales or rhonchi, normal air exchange without accessory muscle use.    CVS Normal rate, regular rhythm and normal S1 and S2.  No murmurs, gallops, or rubs.   Neuro Grossly nonfocal with no obvious sensory or motor deficits.   MSK Normal range of motion in general.  No edema and no tenderness.   Psych Appropriate mood and affect.      Labs:  Recent Results (from the past 24 hour(s))   Ferritin    Collection Time: 02/04/23 11:46 AM   Result Value Ref Range    Ferritin 32 8 - 388 NG/ML  Iron and TIBC    Collection Time: 02/04/23 11:46 AM   Result Value Ref Range    Iron 61 35 - 100 ug/dL    TIBC 952 841 - 324 ug/dL    Iron % Saturation 15 (L) 20 - 50 %    UIBC 355.0 (H) 112.0 - 347.0 ug/dL   Comprehensive Metabolic Panel    Collection Time: 02/04/23 11:46 AM   Result Value Ref Range    Sodium 136 136 - 145 mmol/L    Potassium 4.5 3.5 - 5.1 mmol/L    Chloride 98 98 - 107 mmol/L    CO2 26 20 - 28 mmol/L    Anion Gap 12 9 - 18 mmol/L    Glucose 93 70 - 99 mg/dL    BUN 15 6 - 23 MG/DL    Creatinine 4.01 0.27 - 1.10 MG/DL    Est, Glom Filt Rate 85 >60 ml/min/1.43m2    Calcium 9.4 8.8 - 10.2 MG/DL    Total Bilirubin 0.3 0.0 - 1.2 MG/DL    ALT 31 12 - 65 U/L    AST 34 15 - 37 U/L    Alk Phosphatase 55 35 - 104 U/L    Total Protein 7.8 6.3 - 8.2 g/dL    Albumin 4.3 3.5 - 5.0 g/dL    Globulin 3.5 2.3 - 3.5 g/dL    Albumin/Globulin Ratio 1.2 1.0 - 1.9     CBC with Auto Differential    Collection Time: 02/04/23 11:46 AM   Result Value Ref Range    WBC 5.8 4.3 - 11.1 K/uL    RBC 4.89 4.05 - 5.2 M/uL    Hemoglobin 14.6 11.7 - 15.4 g/dL    Hematocrit 25.3 66.4 - 46.3 %    MCV 89.4 82.0 - 102.0 FL    MCH 29.9 26.1 - 32.9 PG    MCHC 33.4 31.4 - 35.0 g/dL    RDW 40.3 47.4 - 25.9 %    Platelets 298 150 - 450 K/uL    MPV 9.1 (L) 9.4 - 12.3 FL    nRBC 0.00 0.0 - 0.2 K/uL    Neutrophils % 66 43 - 78 %     Lymphocytes % 27 13 - 44 %    Monocytes % 6 4.0 - 12.0 %    Eosinophils % 0 (L) 0.5 - 7.8 %    Basophils % 1 0.0 - 2.0 %    Immature Granulocytes % 0 0.0 - 5.0 %    Neutrophils Absolute 3.8 1.7 - 8.2 K/UL    Lymphocytes Absolute 1.5 0.5 - 4.6 K/UL    Monocytes Absolute 0.3 0.1 - 1.3 K/UL    Eosinophils Absolute 0.0 0.0 - 0.8 K/UL    Basophils Absolute 0.0 0.0 - 0.2 K/UL    Immature Granulocytes Absolute 0.0 0.0 - 0.5 K/UL    Differential Type AUTOMATED         Imaging:  No results found.    ASSESSMENT:   Diagnosis Orders   1. Iron deficiency anemia, unspecified iron deficiency anemia type        2. Fatigue, unspecified type          Patient Active Problem List   Diagnosis    EBV infection    Lymphadenopathy           PLAN:  Lab studies were personally reviewed.    Pertinent old records were reviewed from [facility]    IDA: treated in the past with IV  iron at [facility], most recently July 2023 with subsequent improvement.  Recent CBC in February 2024 showed Hgb of 15.1 with normal MCV, no iron studies performed at that time.      She is here today for follow up for IDA.  Since last seen she has been well.  Fatigue is ongoing, uses caffeine to get through her day.  She denies any bleeding, shortness of breath, ice cravings, or PICA.  There have been no recent illness or infectious symptoms.  Labs reviewed, Hgb 14.6, ferritin up to 32, Fe sat 15% . There is no indication for IV iron and we will continue to monitor.  She will return for repeat labs and follow up in 6 months or sooner.  All questions were asked and answered to the best of my ability.          Joaquin Bend) Klye Besecker, [facility]  [facility]  [address]  [place], [place] [address]  Office : [phone]  Fax : [phone]

## 2023-02-11 ENCOUNTER — Encounter

## 2023-02-12 ENCOUNTER — Ambulatory Visit
Admit: 2023-02-12 | Discharge: 2023-02-12 | Payer: PRIVATE HEALTH INSURANCE | Attending: Rheumatology | Primary: Family Medicine

## 2023-02-12 DIAGNOSIS — M359 Systemic involvement of connective tissue, unspecified: Secondary | ICD-10-CM

## 2023-02-12 MED ORDER — HYDROXYCHLOROQUINE SULFATE 200 MG PO TABS
200 MG | ORAL_TABLET | ORAL | 1 refills | Status: DC
Start: 2023-02-12 — End: 2023-06-10

## 2023-02-12 NOTE — Progress Notes (Signed)
[facility]  Loreen Freud, M.D.  [address]   [place], [id number]  Office : [phone], Fax: [phone]     RHEUMATOLOGY OFFICE VISIT NOTE  Date of Visit:  02/12/2023 8:50 AM    Patient Information:  Name:  Danielle Powell  DOB:  June 27, 1965  Age:  57 y.o.   Gender:  female    Ms. Cortes is here today for follow-up of undifferentiated connective tissue disease, osteopenia and medication monitoring.     Last visit: 01/04/23    History of Present Illness: On talking to patient today she states that she did have an eye exam done 03/19/2022 at [facility] eye care and she does bring along the eye note from the eye doctor's office for me to personally review with her today.  She states that she does have an eye exam scheduled for 03/20/23 at [facility] which she was instructed to keep.  With regard to her BP being high at our office today she states that she has an appt with her PCP on Friday and will discuss with him the next steps that need to be taken to get it under control.  Patient does believe that she is still dealing with infectious mononucleosis which is causing her to have swollen lymph nodes in her neck.  Patient's current joint complaints are as mentioned below.    Since the last visit, patient is feeling "fair".    Pain: 3/10  Location:  Bilateral knee pain with occasional swelling with no buckling. Bilateral shoulder with neck stiffness with some pain with neck ROM. Occasional tension headaches with some para spinal muscle pain. Some lower back pain. , wrist, fingers  Quality:  Stiff achy feeling.   Modifying Factors: Laying on either side worsens the shoulder and knee pain.  Tylenol arthritis helps when she takes it at night.   Associated Symptoms:  No tingling, numbness or pain down the arms or legs. No limitations with her ADL's.         02/12/2023     8:00 AM   DMARD/Biologic   AM Stiffness 30 min   Pain 3   Fatigue 10   MDHAQ 0    Patient Global Score 1   Medication Name Plaquenil     Last TB screen: Years ago  TB result: Always been negative     Current dose of steroids:none  How long on current dose of steroids:NA  How long on continuous steroid therapy:NA     Past DMARDs, if applicable (methotrexate, plaquenil/hydroxychloroquine, sulfasalazine, Arava/leflunomide): Currently taking Plaquenil 200 mg QD.      Past biologics, if applicable (enbrel, humira, simponi, cimzia, Harriette Ohara, [place], remicade, simponi aria, actemra, rituximab, [place], stelara, cosentyx):none     Past NSAIDs, if applicable (motrin, aleve, naproxen, advil, ibuprofen, celebrex, voltaren/diclofenac, etc.):none due to angioedema     Last BMD:11/17/21  Past osteoporosis drugs, if applicable (fosamax, actonel, boniva, reclast, prolia, forteo):Forteo in the past.  Currently on Prolia #2 administered to her on 09/27/22.     BMI:26.21  Current exercise regimen, if any:gym 4x weekly  Current vitamin D dose: Vit D3- 5000 IU a day and one MV qd  Current calcium dose: MV qd  Fractures since last visit, if any: none    The patient otherwise has no significant interval changes in health or medical history to report.     History Reviewed:    Past Medical History  Past Medical History:   Diagnosis  Date    ADD (attention deficit disorder)     ANA positive     Anti-RNP antibodies present     Arthritis     Depression     Endometriosis     Fibromyalgia     Generalized anxiety disorder     Hypertension     Hyperthyroidism     Irritable bowel syndrome     Lupus (HCC)     Mononucleosis     Primary insomnia     Vertigo        Past Surgical History  Past Surgical History:   Procedure Laterality Date    CHOLECYSTECTOMY         Family History  Family History   Problem Relation Age of Onset    Hypertension Mother     Diabetes Mother     Cancer Mother     Heart Disease Father     Mult Sclerosis Sister     Heart Disease Other     Alzheimer's Disease Other        Social History  Social History      Socioeconomic History    Marital status: Married     Spouse name: None    Number of children: None    Years of education: None    Highest education level: None   Tobacco Use    Smoking status: Never     Passive exposure: Never    Smokeless tobacco: Never   Substance and Sexual Activity    Alcohol use: Yes     Alcohol/week: 1.0 standard drink of alcohol     Types: 1 Glasses of wine per week    Drug use: Yes     Types: Other     Comment: Takes Ativan and Ambien. Never used street drugs.     Social Determinants of Health     Financial Resource Strain: Low Risk  (12/04/2022)    Received from [facility], [facility]    Financial Resource Strain     Difficulty Paying Living Expenses: Not hard at all     Difficulty Paying Medical Expenses: No   Food Insecurity: No Food Insecurity (12/04/2022)    Received from [facility], [facility]    Food Insecurity     Worried about Running Out of Food in the Last Year: Never true     Ran Out of Food in the Last Year: Never true   Transportation Needs: No Transportation Needs (12/04/2022)    Received from [facility], [facility]    Transportation Needs     Lack of Transportation: No   Physical Activity: Sufficiently Active (12/04/2022)    Received from [facility], [facility]    Physical Activity     Days of Exercise per Week: 5     Minutes of Exercise per Session: 60     Total Minutes of Exercise per Week: 300   Stress: No Stress Concern Present (12/04/2022)    Received from [facility], [facility]    Stress     Feeling of Stress : Only a little   Social Connections: Moderately Integrated (12/04/2022)    Received from [facility], [facility]    Social Connections     Frequency of Communication with Friends and Family: More than three times a week     Frequency of Social Gatherings with Friends and Family: Once a week   Intimate Partner Violence: Unknown (12/01/2021)    Received from [facility], [facility]  Intimate Partner Violence     Fear of Current  or Ex-Partner: Not asked     Emotionally Abused: Not asked     Physically Abused: Not asked     Sexually Abused: Not asked   Housing Stability: Not At Risk (12/04/2022)    Received from [facility], [facility]    Housing Stability     Was there a time when you did not have a steady place to sleep: No     Worried that the place you are staying is making you sick: No       Allergy:  Allergies   Allergen Reactions    Baloxavir Marboxil Hallucinations     Other Reaction(s): Hallucinations, Hallucinations-Intolerance    Blue Dyes (Parenteral) Anaphylaxis and Angioedema     Other Reaction(s): Angioedema-Allergy    Ibuprofen Swelling     swelling      Nsaids Swelling     Other Reaction(s): Angioedema-Allergy    Red Dye Anaphylaxis, Angioedema and Hives     Other Reaction(s): Angioedema    Other Swelling     Medication not listed in Epic for Influenza    Other Reaction(s): Joint swelling-Intolerance      Medication not listed in Epic for Influenza    Prochlorperazine Rash     Other Reaction(s): Rash-Allergy    Prochlorperazine Edisylate Rash         Current Medications:  Outpatient Encounter Medications as of 02/12/2023   Medication Sig Dispense Refill    hydroxychloroquine (PLAQUENIL) 200 MG tablet Take 1 and 1/2 pill once a day after food. 135 tablet 1    Denosumab (PROLIA SC) Inject into the skin #2 given on 09/27/22.      Multiple Vitamins-Minerals (CENTRUM ADULT PO) Take by mouth      Cholecalciferol (VITAMIN D3) 125 MCG (5000 UT) TABS Take 1 tablet by mouth daily      melatonin 3 MG TABS tablet Take 1 tablet by mouth daily      linaclotide (LINZESS) 290 MCG CAPS capsule Take 1 capsule by mouth daily      amLODIPine (NORVASC) 10 MG tablet Take 1 tablet by mouth daily      EPINEPHrine (EPIPEN) 0.3 MG/0.3ML SOAJ injection epinephrine 0.3 mg/0.3 mL injection, auto-injector   INJECT 0.3 ML (0.3 MG) INTO THE MUSCLE ONCE AS NEEDED FOR ANAPHYLAXIS FOR UP TO 1 DOSE      montelukast sodium (SINGULAIR) 4 MG PACK        fexofenadine (ALLEGRA ALLERGY) 180 MG tablet Take 1 tablet by mouth daily      famotidine (PEPCID) 20 MG tablet Take 1 tablet by mouth daily      LORazepam (ATIVAN) 1 MG tablet Take 1 tablet by mouth 2 times daily as needed.      zolpidem (AMBIEN) 5 MG tablet zolpidem 5 mg tablet   TAKE 1 TABLET BY MOUTH NIGHTLY      Magnesium 300 MG CAPS 1 capsule with a meal Orally Once a day for 30 day(s)      cycloSPORINE (RESTASIS) 0.05 % ophthalmic emulsion Restasis 0.05 % eye drops in a dropperette   INSTILL 1 DROP INTO BOTH EYES TWICE A DAY      [DISCONTINUED] hydroxychloroquine (PLAQUENIL) 200 MG tablet Take 1 and 1/2 pill once a day after food. 135 tablet 0    [DISCONTINUED] predniSONE (DELTASONE) 5 MG tablet Take 1 pill once a day after breakfast. 30 tablet 2    estrogens conjugated (PREMARIN) 0.625 MG/GM CREA  vaginal cream Premarin 0.625 mg/gram vaginal cream   INSERT 0.5 APPLICATORSFUL EVERY DAY BY VAGINAL ROUTE FOR 30 DAYS. (Patient not taking: Reported on 02/12/2023)      [DISCONTINUED] Omega-3 Fatty Acids (FISH OIL) 1000 MG capsule 1 capsule (Patient not taking: Reported on 01/04/2023)       No facility-administered encounter medications on file as of 02/12/2023.           REVIEW OF SYSTEMS: The following systems were reviewed with patient today and were negative except for the following (depicted with an "X"):        "X" General  "X" Head and Neck  "X" Heart and Breathing  "X" Gastrointestinal   x Fever/chills  x Hair loss   Shortness of breath   Upset stomach    Falls   Dry mouth  x Coughing  x Diarrhea / constipation    Wt loss   Mouth sores   Wheezing   Heartburn   x Wt gain   Ringing ears   Chest pain   Dark or bloody stools    Night sweats   Diff. swallowing   None of above   Nausea or vomiting    None of above   None of above      None of above                "X" Skin  "X" Neurology  "X" Urinary/Gyn  "X" Other    Easy bruising  x Numbness/ tingling   Female problems  x Depression   x Rashes  x Weakness   Problems  with urination   Feeling anxious   x Sun sensitivity  x Headaches  X None of above  x Problems sleeping    None of above   None of above      None of above     Physical Exam:  Blood pressure (!) 142/97, pulse 62, height 1.524 m (5'), weight 60.9 kg (134 lb 3.2 oz).  General:  Patient alert, cooperative and in no apparent distress.  HEENT: Pupils equally reactive to light and accomodation, no scleral injection noted. Neck supple, no lymphadenopathy, no thyromegaly.    Heart: Regular rate and rhythm, normal S1 and S2 with no rubs or gallops.   Lungs: Clear to auscultation bilaterally with no audible wheeze or rhonchi.   Abdomen: Soft, nontender, no hepatosplenomegaly.   Skin:  No rashes. No nail abnormalities.  Neurologic:  Oriented, normal speech and affect.  Normal gait.    Extremities:  No edema in bilateral lower extremities with no cyanosis or clubbing.    Muskoskeletal Exam:     I examined the shoulders, elbows, wrists, MCPs, PIPs, DIPs and knees bilaterally for strength, range of motion, deformity, tenderness, swelling, and synovitis.      The findings are:           Physical Exam              Joint Exam 02/12/2023        Right  Left   Glenohumeral   Tender   Tender    Comments  Tenderness anteriorly but not superiorly with intact range of motion to abduction as well as internal rotation.    Tenderness anteriorly but not superiorly with intact range of motion to abduction as well as internal rotation.   Cervical Spine   Tender      Lumbar Spine   Tender      Knee   Tender  Tender   Ankle   Tender   Tender     CDAI: 2.37    Patient otherwise has a normal joint exam without other evidence of joint tenderness, synovitis, warmth, erythema, decreased ROM, weakness or deformities.    Radiology Reports Reviewed (if available):  Last 3 months     XR LUMBAR SPINE (2-3 VIEWS)  Narrative: EXAM: XR LUMBAR SPINE (2-3 VIEWS)  INDICATION: Low back pain, unspecified; Other chronic pain  COMPARISON: None.    TECHNIQUE: Lumbar  Spine AP/LAT imaging was obtained.     FINDINGS:  Straightening of the normal lumbar lordosis. Vertebral body heights are  preserved. No radiographic evidence of fracture. Mild discogenic degenerative  change of the visualized spine. Bone mineralization and soft tissues are within  normal limits. Cholecystectomy clips.  Impression: Mild spondylosis.    Electronically signed by Lavell Luster         Lab Reports Reviewed (if available): Last 3 months    Hospital Outpatient Visit on 02/04/2023   Component Date Value Ref Range Status    Ferritin 02/04/2023 32  8 - 388 NG/ML Final    Iron 02/04/2023 61  35 - 100 ug/dL Final    TIBC 53/66/4403 416  240 - 450 ug/dL Final    Iron % Saturation 02/04/2023 15 (L)  20 - 50 % Final    UIBC 02/04/2023 355.0 (H)  112.0 - 347.0 ug/dL Final    Sodium 47/42/5956 136  136 - 145 mmol/L Final    Potassium 02/04/2023 4.5  3.5 - 5.1 mmol/L Final    Chloride 02/04/2023 98  98 - 107 mmol/L Final    CO2 02/04/2023 26  20 - 28 mmol/L Final    Anion Gap 02/04/2023 12  9 - 18 mmol/L Final    Glucose 02/04/2023 93  70 - 99 mg/dL Final    Comment: <38 mg/dL Consistent with, but not fully diagnostic of hypoglycemia.  100 - 125 mg/dL Impaired fasting glucose/consistent with pre-diabetes mellitus.  > 126 mg/dl Fasting glucose consistent with overt diabetes mellitus      BUN 02/04/2023 15  6 - 23 MG/DL Final    Creatinine 75/64/3329 0.81  0.60 - 1.10 MG/DL Final    Est, Glom Filt Rate 02/04/2023 85  >60 ml/min/1.17m2 Final    Comment:    Pediatric calculator link: https://www.kidney.org/professionals/kdoqi/gfr_calculatorped     These results are not intended for use in patients <75 years of age.     eGFR results are calculated without a race factor using  the 2021 CKD-EPI equation. Careful clinical correlation is recommended, particularly when comparing to results calculated using previous equations.  The CKD-EPI equation is less accurate in patients with extremes of muscle mass, extra-renal  metabolism of creatinine, excessive creatine ingestion, or following therapy that affects renal tubular secretion.      Calcium 02/04/2023 9.4  8.8 - 10.2 MG/DL Final    Total Bilirubin 02/04/2023 0.3  0.0 - 1.2 MG/DL Final    ALT 51/88/4166 31  12 - 65 U/L Final    AST 02/04/2023 34  15 - 37 U/L Final    Alk Phosphatase 02/04/2023 55  35 - 104 U/L Final    Total Protein 02/04/2023 7.8  6.3 - 8.2 g/dL Final    Albumin 12/23/1599 4.3  3.5 - 5.0 g/dL Final    Globulin 09/32/3557 3.5  2.3 - 3.5 g/dL Final    Albumin/Globulin Ratio 02/04/2023 1.2  1.0 - 1.9   Final  WBC 02/04/2023 5.8  4.3 - 11.1 K/uL Final    RBC 02/04/2023 4.89  4.05 - 5.2 M/uL Final    Hemoglobin 02/04/2023 14.6  11.7 - 15.4 g/dL Final    Hematocrit 13/01/6577 43.7  35.8 - 46.3 % Final    MCV 02/04/2023 89.4  82.0 - 102.0 FL Final    MCH 02/04/2023 29.9  26.1 - 32.9 PG Final    MCHC 02/04/2023 33.4  31.4 - 35.0 g/dL Final    RDW 46/96/2952 12.1  11.9 - 14.6 % Final    Platelets 02/04/2023 298  150 - 450 K/uL Final    MPV 02/04/2023 9.1 (L)  9.4 - 12.3 FL Final    nRBC 02/04/2023 0.00  0.0 - 0.2 K/uL Final    **Note: Absolute NRBC parameter is now reported with Hemogram**    Neutrophils % 02/04/2023 66  43 - 78 % Final    Lymphocytes % 02/04/2023 27  13 - 44 % Final    Monocytes % 02/04/2023 6  4.0 - 12.0 % Final    Eosinophils % 02/04/2023 0 (L)  0.5 - 7.8 % Final    Basophils % 02/04/2023 1  0.0 - 2.0 % Final    Immature Granulocytes % 02/04/2023 0  0.0 - 5.0 % Final    Neutrophils Absolute 02/04/2023 3.8  1.7 - 8.2 K/UL Final    Lymphocytes Absolute 02/04/2023 1.5  0.5 - 4.6 K/UL Final    Monocytes Absolute 02/04/2023 0.3  0.1 - 1.3 K/UL Final    Eosinophils Absolute 02/04/2023 0.0  0.0 - 0.8 K/UL Final    Basophils Absolute 02/04/2023 0.0  0.0 - 0.2 K/UL Final    Immature Granulocytes Absolute 02/04/2023 0.0  0.0 - 0.5 K/UL Final    Differential Type 02/04/2023 AUTOMATED    Final   Orders Only on 01/04/2023   Component Date Value Ref Range  Status    CRP 01/04/2023 <1  0 - 10 mg/L Final    Comment: (NOTE)  Performed At: [facility]  [address] [place], [place] [id number]  Jolene Schimke MD UU:[id number]      Sed Rate, Automated 01/04/2023 3  0 - 30 mm/hr Final     The results above were reviewed and discussed with patient.       Assessment/Plan:   Claree Mcinerny is a 57 y.o. female who presents with:     Undifferentiated connective tissue disease (HCC): Patient was instructed to continue hydroxychloroquine 200 mg a pill and a half once a day to be taken after food for now.  Her most recent eye exam from 03/19/2022 did not show any evidence of Plaquenil toxicity.  I did personally review those eye notes.  Since she does have an eye exam scheduled for September of this year she was instructed to keep that appointment to monitor for Plaquenil toxicity.  -     hydroxychloroquine (PLAQUENIL) 200 MG tablet; Take 1 and 1/2 pill once a day after food.    Degeneration of lumbar intervertebral disc: Greater than 50% of the 40-minute visit was spent reviewing the patient's last L-spine as well as right hip x-ray results with her as mentioned above.  Since the patient has a true allergy to all NSAID's she was instructed to continue Tylenol for pain relief for now but if it does not help her anymore with pain relief she was instructed to call me and I will put in a referral for her to see pain management.  Osteopenia of multiple sites: Since patient did have a last DEXA scan on 11/17/2021 and her last Prolia shot on 09/27/2022 she will be due for her next Prolia shot on or after 03/29/2023.  Patient will be notified to come in to have her Prolia shot at our office.  She will be due for her next DEXA scan on or after 11/18/2023.  Patient was instructed to continue vitamin D 5000 IUs daily along with the multivitamin 1 pill to be taken once a day.  Since patient's last checked vitamin D 25-hydroxy level was on 12/10/2022 I do plan on checking it in June  of next year.    Disease activity plan:  As stated above.    Steroid management plan:  As stated above, if applicable.    Pain management plan:  As stated above, if applicable.    Weight management plan:  Weight loss through diet and exercise is always encouraged    Disease prognosis: Good        I appreciate the opportunity to continue to participate in the care of this patient.     Follow-up and Dispositions    Return in about 4 months (around 06/14/2023).       Electronically signed by:  Loreen Freud, MD      This note was dictated using dragon voice recognition software.  It has been proofread, but there may still exist voice recognition errors that the author did not detect.                --------------------------------------------------------------------------------------------------------------------------------------------------------------------------------------------------------------------------------

## 2023-02-28 NOTE — Telephone Encounter (Signed)
-----   Message from Eveline H sent at 02/18/2023  8:09 AM EDT -----  Regarding: RE: prolia   bif req 02/05/2023/ bif rec 02/07/2023 [employer]-PA req. ded 1500.0 met 1000.00. OOP 5000.00 met 2441.73 co-ins 20% co-pay card $1500.00 available  ----- Message -----  From: Marigene Perla GRADE, MA  Sent: 02/14/2023   3:10 PM EDT  To: Jacqualin Joella Perla GRADE Marigene, MA  Subject: FW: prolia                                        Ev, please verify benefits for Prolia . thanks  ----- Message -----  From: Marigene Perla GRADE, MA  Sent: 02/12/2023  12:29 PM EDT  To: Perla GRADE Marigene, MA  Subject: prolia                                            Rupert Gay PARAS, MD  Marigene Perla GRADE, MA  Please schedule the patient for the next Prolia  shot on or after 03/29/2023. Thanks

## 2023-03-08 NOTE — Telephone Encounter (Signed)
 [facility] CALL to [facility] to start [facility] for Prolia . Have to use portal at [facility].com  Patients benefits termed 02/23/2023.   Called patient and left detailed message that [employer] is saying her coverage is termed and I need new insurance information before we can schedule her for the Prolia  in October

## 2023-03-20 ENCOUNTER — Inpatient Hospital Stay: Admit: 2023-03-20 | Payer: PRIVATE HEALTH INSURANCE | Primary: Family Medicine

## 2023-03-20 ENCOUNTER — Encounter

## 2023-03-20 DIAGNOSIS — D509 Iron deficiency anemia, unspecified: Secondary | ICD-10-CM

## 2023-03-20 LAB — CBC WITH AUTO DIFFERENTIAL
Basophils %: 1 % (ref 0.0–2.0)
Basophils Absolute: 0 10*3/uL (ref 0.0–0.2)
Eosinophils %: 1 % (ref 0.5–7.8)
Eosinophils Absolute: 0 10*3/uL (ref 0.0–0.8)
Hematocrit: 41.4 % (ref 35.8–46.3)
Hemoglobin: 14.1 g/dL (ref 11.7–15.4)
Immature Granulocytes %: 0 % (ref 0.0–5.0)
Immature Granulocytes Absolute: 0 10*3/uL (ref 0.0–0.5)
Lymphocytes %: 32 % (ref 13–44)
Lymphocytes Absolute: 2 10*3/uL (ref 0.5–4.6)
MCH: 30.1 pg (ref 26.1–32.9)
MCHC: 34.1 g/dL (ref 31.4–35.0)
MCV: 88.5 FL (ref 82.0–102.0)
MPV: 8.6 FL — ABNORMAL LOW (ref 9.4–12.3)
Monocytes %: 7 % (ref 4.0–12.0)
Monocytes Absolute: 0.4 10*3/uL (ref 0.1–1.3)
Neutrophils %: 59 % (ref 43–78)
Neutrophils Absolute: 3.7 10*3/uL (ref 1.7–8.2)
Platelets: 284 10*3/uL (ref 150–450)
RBC: 4.68 M/uL (ref 4.05–5.2)
RDW: 12.5 % (ref 11.9–14.6)
WBC: 6.2 10*3/uL (ref 4.3–11.1)
nRBC: 0 10*3/uL (ref 0.0–0.2)

## 2023-03-20 LAB — IRON AND TIBC
Iron % Saturation: 25 % (ref 20–50)
Iron: 81 ug/dL (ref 35–100)
TIBC: 324 ug/dL (ref 240–450)
UIBC: 243 ug/dL (ref 112.0–347.0)

## 2023-03-20 LAB — FERRITIN: Ferritin: 54 ng/mL (ref 8–388)

## 2023-04-01 ENCOUNTER — Encounter

## 2023-04-01 NOTE — Telephone Encounter (Signed)
PHONE CALL to patient to see if she has her new insurance card yet.   She has COBRA- with [employer].   Due Prolia 03/29/23.   Benefit period started 02/24/23  [email or web address] showing she has $1800 ded ($1000 met), family ded $3000 ($2500 met)  $5500 OOP max ($1610 met), $11,000 fam OOP max (6394 met)    PHONE CALL to PA dept at [facility] to check status of PA for Prolia. [phone].   Spoke with Lurena Joiner.   Ref # [id number]. Duration 60 mg every 6 months within time frame for medical necessity (Approved 09/10/22- 09/09/23)   Lurena Joiner D 04/01/23.     Called patient back. Claim paid 100% in April, so will expect it to process the same. If not, she has $1500 on copay card that can be used. LEFT MESSAGE for her to call me back to schedule her appt.

## 2023-04-02 NOTE — Telephone Encounter (Signed)
Patient called back and spoke with the front desk. She is scheduled for 04/03/23 for the Prolia.

## 2023-04-02 NOTE — Progress Notes (Signed)
Therapy plan for Denosumab was electronically signed by me today.

## 2023-04-03 ENCOUNTER — Encounter: Admit: 2023-04-03 | Discharge: 2023-04-03 | Payer: PRIVATE HEALTH INSURANCE | Primary: Family Medicine

## 2023-04-03 DIAGNOSIS — M8589 Other specified disorders of bone density and structure, multiple sites: Secondary | ICD-10-CM

## 2023-04-03 MED ORDER — SODIUM CHLORIDE 0.9 % IV SOLN
0.9 | INTRAVENOUS | Status: DC
Start: 2023-04-03 — End: 2023-04-03

## 2023-04-03 MED ORDER — DIPHENHYDRAMINE HCL 50 MG/ML IJ SOLN
50 | Freq: Once | INTRAMUSCULAR | Status: DC | PRN
Start: 2023-04-03 — End: 2023-04-03

## 2023-04-03 MED ORDER — ACETAMINOPHEN 325 MG PO TABS
325 | Freq: Once | ORAL | Status: DC | PRN
Start: 2023-04-03 — End: 2023-04-03

## 2023-04-03 MED ORDER — DENOSUMAB 60 MG/ML SC SOSY
60 | Freq: Once | SUBCUTANEOUS | Status: AC
Start: 2023-04-03 — End: 2023-04-03
  Administered 2023-04-03: 16:00:00 60 mg via SUBCUTANEOUS

## 2023-04-03 MED ORDER — HYDROCORTISONE SOD SUC (PF) 100 MG IJ SOLR
100 | Freq: Once | INTRAMUSCULAR | Status: DC | PRN
Start: 2023-04-03 — End: 2023-04-03

## 2023-04-03 MED ORDER — ALBUTEROL SULFATE HFA 108 (90 BASE) MCG/ACT IN AERS
108 | RESPIRATORY_TRACT | Status: DC | PRN
Start: 2023-04-03 — End: 2023-04-03

## 2023-04-03 MED ORDER — EPINEPHRINE PF 1 MG/ML IJ SOLN
1 | INTRAMUSCULAR | Status: DC | PRN
Start: 2023-04-03 — End: 2023-04-03

## 2023-04-03 MED ORDER — ONDANSETRON HCL 4 MG/2ML IJ SOLN
4 | Freq: Once | INTRAMUSCULAR | Status: DC | PRN
Start: 2023-04-03 — End: 2023-04-03

## 2023-04-03 MED ORDER — SODIUM CHLORIDE (PF) 0.9 % IJ SOLN
0.9 | Freq: Once | INTRAMUSCULAR | Status: DC | PRN
Start: 2023-04-03 — End: 2023-04-03

## 2023-04-03 NOTE — Progress Notes (Signed)
[facility]  [address]  [place], [place] [address]  Office : [phone], Fax: [phone]       # 3 Prolia 60mg /ml injected SC today into left arm. Patient tolerated injection well. Advised patient to continue with calcium and vitamin D post injection. Advised patient to call with any problems post injection.   Medication ordered by Dr. Chriss Czar    Pre-infusion/injection questionairre for osteoporosis    1. Have you had or are you planning any dental work? No    2. Are you taking your calcium and vitamin D? Yes    3. When was your last osteoporosis treatment? 09/27/2022    4. Have you had any recent fractures? No    5. Are you currently in skilled nursing or in patient rehab? No

## 2023-04-12 NOTE — Telephone Encounter (Signed)
Error

## 2023-05-02 LAB — EXTERNAL UNMAPPED PROCEDURES: Hemoglobin A1C, External: 5.1 % (ref <5.7)

## 2023-05-03 ENCOUNTER — Encounter

## 2023-05-06 ENCOUNTER — Other Ambulatory Visit: Payer: PRIVATE HEALTH INSURANCE | Primary: Family Medicine

## 2023-05-06 ENCOUNTER — Encounter

## 2023-05-06 DIAGNOSIS — D509 Iron deficiency anemia, unspecified: Secondary | ICD-10-CM

## 2023-05-06 LAB — CBC WITH AUTO DIFFERENTIAL
Basophils %: 1 % (ref 0.0–2.0)
Basophils Absolute: 0 10*3/uL (ref 0.0–0.2)
Eosinophils %: 2 % (ref 0.5–7.8)
Eosinophils Absolute: 0.1 10*3/uL (ref 0.0–0.8)
Hematocrit: 39.6 % (ref 35.8–46.3)
Hemoglobin: 13 g/dL (ref 11.7–15.4)
Immature Granulocytes %: 0 % (ref 0.0–5.0)
Immature Granulocytes Absolute: 0 10*3/uL (ref 0.0–0.5)
Lymphocytes %: 41 % (ref 13–44)
Lymphocytes Absolute: 1.8 10*3/uL (ref 0.5–4.6)
MCH: 30.2 pg (ref 26.1–32.9)
MCHC: 32.8 g/dL (ref 31.4–35.0)
MCV: 92.1 fL (ref 82.0–102.0)
MPV: 8.9 fL — ABNORMAL LOW (ref 9.4–12.3)
Monocytes %: 10 % (ref 4.0–12.0)
Monocytes Absolute: 0.4 10*3/uL (ref 0.1–1.3)
Neutrophils %: 47 % (ref 43–78)
Neutrophils Absolute: 2 10*3/uL (ref 1.7–8.2)
Platelets: 368 10*3/uL (ref 150–450)
RBC: 4.3 M/uL (ref 4.05–5.2)
RDW: 13.6 % (ref 11.9–14.6)
WBC: 4.3 10*3/uL (ref 4.3–11.1)
nRBC: 0 10*3/uL (ref 0.0–0.2)

## 2023-05-06 LAB — IRON AND TIBC
Iron % Saturation: 13 % — ABNORMAL LOW (ref 20–50)
Iron: 47 ug/dL (ref 35–100)
TIBC: 350 ug/dL (ref 240–450)
UIBC: 303 ug/dL (ref 112.0–347.0)

## 2023-05-06 LAB — FERRITIN: Ferritin: 17 ng/mL (ref 8–388)

## 2023-05-06 NOTE — Progress Notes (Signed)
Labs from today reviewed with Dr. Mikal Plane, Infed therapy plan routed to Dr. Mikal Plane for signature per MD request.

## 2023-05-14 ENCOUNTER — Ambulatory Visit
Admit: 2023-05-14 | Discharge: 2023-05-15 | Disposition: A | Discharge status: Home / Self Care | Payer: PRIVATE HEALTH INSURANCE | Attending: Internal Medicine | Admitting: Internal Medicine | Primary: Family Medicine

## 2023-05-14 VITALS — BP 129/76 | HR 83 | Temp 98.90000°F | Resp 16

## 2023-05-14 DIAGNOSIS — D509 Iron deficiency anemia, unspecified: Secondary | ICD-10-CM

## 2023-05-14 MED ORDER — MEPERIDINE HCL 25 MG/ML IJ SOLN
25 | INTRAMUSCULAR | Status: DC | PRN
Start: 2023-05-14 — End: 2023-05-15

## 2023-05-14 MED ORDER — SODIUM CHLORIDE 0.9 % IV SOLN
0.9 | INTRAVENOUS | Status: DC | PRN
Start: 2023-05-14 — End: 2023-05-15

## 2023-05-14 MED ORDER — EPINEPHRINE PF 1 MG/ML IJ SOLN
1 | INTRAMUSCULAR | Status: DC | PRN
Start: 2023-05-14 — End: 2023-05-15

## 2023-05-14 MED ORDER — SODIUM CHLORIDE 0.9 % IV SOLN
0.9 | INTRAVENOUS | Status: DC
Start: 2023-05-14 — End: 2023-05-15

## 2023-05-14 MED ORDER — DIPHENHYDRAMINE HCL 50 MG/ML IJ SOLN
50 | Freq: Once | INTRAMUSCULAR | Status: DC | PRN
Start: 2023-05-14 — End: 2023-05-15

## 2023-05-14 MED ORDER — SODIUM CHLORIDE (PF) 0.9 % IJ SOLN
0.9 % | Freq: Once | INTRAMUSCULAR | Status: DC | PRN
Start: 2023-05-14 — End: 2023-05-15

## 2023-05-14 MED ORDER — ALBUTEROL SULFATE HFA 108 (90 BASE) MCG/ACT IN AERS
108 | RESPIRATORY_TRACT | Status: DC | PRN
Start: 2023-05-14 — End: 2023-05-15

## 2023-05-14 MED ORDER — SODIUM CHLORIDE 0.9 % IV SOLN
0.9 | Freq: Once | INTRAVENOUS | Status: AC
Start: 2023-05-14 — End: 2023-05-14
  Administered 2023-05-14: 13:00:00 25 mg via INTRAVENOUS

## 2023-05-14 MED ORDER — NORMAL SALINE FLUSH 0.9 % IV SOLN
0.9 | INTRAVENOUS | Status: DC | PRN
Start: 2023-05-14 — End: 2023-05-15
  Administered 2023-05-14: 19:00:00 10 mL via INTRAVENOUS

## 2023-05-14 MED ORDER — ACETAMINOPHEN 325 MG PO TABS
325 | Freq: Once | ORAL | Status: AC
Start: 2023-05-14 — End: 2023-05-14
  Administered 2023-05-14: 14:00:00 650 mg via ORAL

## 2023-05-14 MED ORDER — IRON DEXTRAN 50 MG/ML IJ SOLN
50 MG/ML | Freq: Once | INTRAMUSCULAR | Status: AC
Start: 2023-05-14 — End: 2023-05-14
  Administered 2023-05-14: 14:00:00 975 mg via INTRAVENOUS

## 2023-05-14 MED ORDER — ACETAMINOPHEN 325 MG PO TABS
325 | Freq: Once | ORAL | Status: DC | PRN
Start: 2023-05-14 — End: 2023-05-15

## 2023-05-14 MED ORDER — HYDROCORTISONE SOD SUC (PF) 100 MG IJ SOLR
100 | Freq: Once | INTRAMUSCULAR | Status: DC | PRN
Start: 2023-05-14 — End: 2023-05-15

## 2023-05-14 MED ORDER — ONDANSETRON HCL 4 MG/2ML IJ SOLN
4 | Freq: Once | INTRAMUSCULAR | Status: DC | PRN
Start: 2023-05-14 — End: 2023-05-15

## 2023-05-14 MED ORDER — DEXAMETHASONE SODIUM PHOSPHATE 10 MG/ML IJ SOLN
10 | Freq: Once | INTRAMUSCULAR | Status: AC
Start: 2023-05-14 — End: 2023-05-14
  Administered 2023-05-14: 14:00:00 10 mg via INTRAVENOUS

## 2023-05-14 MED FILL — SOLU-CORTEF 100 MG IJ SOLR: 100 MG | INTRAMUSCULAR | Qty: 2 | Fill #0

## 2023-05-14 MED FILL — FAMOTIDINE (PF) 20 MG/2ML IV SOLN: 20 MG/2ML | INTRAVENOUS | Qty: 2 | Fill #0

## 2023-05-14 MED FILL — ONDANSETRON HCL 4 MG/2ML IJ SOLN: 4 MG/2ML | INTRAMUSCULAR | Qty: 4 | Fill #0

## 2023-05-14 MED FILL — ACETAMINOPHEN 325 MG PO TABS: 325 MG | ORAL | Qty: 2 | Fill #0

## 2023-05-14 MED FILL — INFED 50 MG/ML IJ SOLN: 50 MG/ML | INTRAMUSCULAR | Qty: 19.5 | Fill #0

## 2023-05-14 MED FILL — DIPHENHYDRAMINE HCL 50 MG/ML IJ SOLN: 50 MG/ML | INTRAMUSCULAR | Qty: 1 | Fill #0

## 2023-05-14 MED FILL — INFED 50 MG/ML IJ SOLN: 50 MG/ML | INTRAMUSCULAR | Qty: 0.5 | Fill #0

## 2023-05-14 MED FILL — DEXAMETHASONE SODIUM PHOSPHATE 10 MG/ML IJ SOLN: 10 MG/ML | INTRAMUSCULAR | Qty: 1 | Fill #0

## 2023-05-14 NOTE — Progress Notes (Signed)
 Arrived to the Infusion Center.  infed completed. Patient tolerated well.   Any issues or concerns during appointment: no.  Patient aware of next lab and [facility] office visit on 08/05/2023  Patient instructed to call provider with temperature of 100.4 or great

## 2023-06-10 ENCOUNTER — Ambulatory Visit
Admit: 2023-06-10 | Discharge: 2023-06-10 | Payer: PRIVATE HEALTH INSURANCE | Attending: Rheumatology | Admitting: Rheumatology | Primary: Family Medicine

## 2023-06-10 VITALS — BP 127/86 | HR 59 | Ht 60.0 in | Wt 135.2 lb

## 2023-06-10 DIAGNOSIS — M359 Systemic involvement of connective tissue, unspecified: Secondary | ICD-10-CM

## 2023-06-10 MED ORDER — HYDROXYCHLOROQUINE SULFATE 200 MG PO TABS
200 | ORAL_TABLET | ORAL | 1 refills | Status: AC
Start: 2023-06-10 — End: ?

## 2023-06-10 NOTE — Progress Notes (Signed)
 [facility]  Loreen Freud, M.D.  [address]   [place], [id number]  Office : [phone], Fax: [phone]     RHEUMATOLOGY OFFICE VISIT NOTE  Date of Visit:  06/10/2023 8:36 AM    Patient Informati

## 2023-08-05 ENCOUNTER — Ambulatory Visit
Admit: 2023-08-05 | Discharge: 2023-08-05 | Payer: PRIVATE HEALTH INSURANCE | Attending: Family | Primary: Family Medicine

## 2023-08-05 ENCOUNTER — Inpatient Hospital Stay: Admit: 2023-08-05 | Payer: PRIVATE HEALTH INSURANCE | Primary: Family Medicine

## 2023-08-05 VITALS — BP 140/83 | HR 68 | Temp 97.90000°F | Resp 16 | Ht 60.0 in | Wt 130.0 lb

## 2023-08-05 DIAGNOSIS — D649 Anemia, unspecified: Secondary | ICD-10-CM

## 2023-08-05 DIAGNOSIS — D509 Iron deficiency anemia, unspecified: Secondary | ICD-10-CM

## 2023-08-05 LAB — CBC WITH AUTO DIFFERENTIAL
Basophils %: 0.6 % (ref 0.0–2.0)
Basophils Absolute: 0.04 10*3/uL (ref 0.00–0.20)
Eosinophils %: 1 % (ref 0.5–7.8)
Eosinophils Absolute: 0.07 10*3/uL (ref 0.00–0.80)
Hematocrit: 42.7 % (ref 35.8–46.3)
Hemoglobin: 14.4 g/dL (ref 11.7–15.4)
Immature Granulocytes %: 0.3 % (ref 0.0–5.0)
Immature Granulocytes Absolute: 0.02 10*3/uL (ref 0.00–0.50)
Lymphocytes %: 23.9 % (ref 13.0–44.0)
Lymphocytes Absolute: 1.71 10*3/uL (ref 0.50–4.60)
MCH: 30 pg (ref 26.1–32.9)
MCHC: 33.7 g/dL (ref 31.4–35.0)
MCV: 89 fL (ref 82.0–102.0)
MPV: 8.8 fL — ABNORMAL LOW (ref 9.4–12.3)
Monocytes %: 5.2 % (ref 4.0–12.0)
Monocytes Absolute: 0.37 10*3/uL (ref 0.10–1.30)
Neutrophils %: 69 % (ref 43.0–78.0)
Neutrophils Absolute: 4.95 10*3/uL (ref 1.70–8.20)
Platelets: 315 10*3/uL (ref 150–450)
RBC: 4.8 M/uL (ref 4.05–5.2)
RDW: 12.2 % (ref 11.9–14.6)
WBC: 7.2 10*3/uL (ref 4.3–11.1)
nRBC: 0 10*3/uL (ref 0.0–0.2)

## 2023-08-05 LAB — IRON AND TIBC
Iron % Saturation: 35 % (ref 20–50)
Iron: 90 ug/dL (ref 35–100)
TIBC: 257 ug/dL (ref 240–450)
UIBC: 167 ug/dL (ref 112.0–347.0)

## 2023-08-05 LAB — COMPREHENSIVE METABOLIC PANEL
ALT: 38 U/L (ref 8–45)
AST: 33 U/L (ref 15–37)
Albumin/Globulin Ratio: 1.2 (ref 1.0–1.9)
Albumin: 4.3 g/dL (ref 3.5–5.0)
Alk Phosphatase: 44 U/L (ref 35–104)
Anion Gap: 10 mmol/L (ref 7–16)
BUN: 19 mg/dL (ref 6–23)
CO2: 26 mmol/L (ref 20–29)
Calcium: 9.8 mg/dL (ref 8.8–10.2)
Chloride: 98 mmol/L (ref 98–107)
Creatinine: 0.79 mg/dL (ref 0.60–1.10)
Est, Glom Filt Rate: 88 mL/min/{1.73_m2} (ref >60)
Globulin: 3.5 g/dL (ref 2.3–3.5)
Glucose: 77 mg/dL (ref 70–99)
Potassium: 3.8 mmol/L (ref 3.5–5.1)
Sodium: 134 mmol/L — ABNORMAL LOW (ref 136–145)
Total Bilirubin: 0.2 mg/dL (ref 0.0–1.2)
Total Protein: 7.8 g/dL (ref 6.3–8.2)

## 2023-08-05 LAB — FERRITIN: Ferritin: 392 ng/mL — ABNORMAL HIGH (ref 8–388)

## 2023-08-05 NOTE — Progress Notes (Signed)
[facility]: Office Visit Established Patient H & P    Chief Complaint:    Chief Complaint   Patient presents with    Follow-up         History of Present Illness:  [name] is a 58 y.o. female who presents today for evaluation regarding iron deficiency anemia.  She has been previously followed at [facility] hematology, she is receiving intermittent iron infusions for IDA, most recently in July 2023.  She saw them in November 2023 and her iron stores were improving so no treatment was given, she has been intolerant to oral iron historically.  Of note she has a history of connective tissue disease and is followed by rheumatology.  Most recent labs at primary care showed a Hgb of 15.1 with no evidence of microcytosis, iron studies were not done at that time.  She now presents to [facility] for evaluation as she has [employer] and the ongoing network dispute with [facility] her OON for her previous hematologist.       She returns today for follow up for IDA.  Since last seen she received Infed in November and reports improved energy level since then and the resolution of ice cravings.  There has been no obvious bleeding.  She is currently recovering from a URI.     Review of Systems:  Constitutional: Negative.  HENT: Negative.   Eyes: Negative.   Respiratory: Negative.   Cardiovascular: Negative.   Gastrointestinal: Negative.   Genitourinary: Negative.   Musculoskeletal: Negative.   Skin: Negative.   Neurological: Negative.   Endo/Heme/Allergies: Negative.   Psychiatric/Behavioral: Negative.   All other systems reviewed and are negative.     Allergies   Allergen Reactions    Baloxavir Marboxil Hallucinations     Other Reaction(s): Hallucinations, Hallucinations-Intolerance    Blue Dyes (Parenteral) Anaphylaxis and Angioedema     Other Reaction(s): Angioedema-Allergy    Ibuprofen Swelling     swelling      Nsaids Swelling     Other Reaction(s): Angioedema-Allergy    Red Dye #40 (Allura Red)  Anaphylaxis, Angioedema and Hives     Other Reaction(s): Angioedema    Other Swelling     Medication not listed in Epic for Influenza    Other Reaction(s): Joint swelling-Intolerance      Medication not listed in Epic for Influenza    Prochlorperazine Rash     Other Reaction(s): Rash-Allergy    Prochlorperazine Edisylate Rash     Past Medical History:   Diagnosis Date    ADD (attention deficit disorder)     ANA positive     Anti-RNP antibodies present     Arthritis     Depression     Endometriosis     Fibromyalgia     Generalized anxiety disorder     Hypertension     Hyperthyroidism     Irritable bowel syndrome     Lupus     Mononucleosis     Primary insomnia     Vertigo      Past Surgical History:   Procedure Laterality Date    CHOLECYSTECTOMY       Family History   Problem Relation Age of Onset    Hypertension Mother     Diabetes Mother     Cancer Mother     Heart Disease Father     Mult Sclerosis Sister     Heart Disease Other     Alzheimer's Disease Other  Social History     Socioeconomic History    Marital status: Married     Spouse name: Not on file    Number of children: Not on file    Years of education: Not on file    Highest education level: Not on file   Occupational History    Not on file   Tobacco Use    Smoking status: Never     Passive exposure: Never    Smokeless tobacco: Never   Substance and Sexual Activity    Alcohol use: Yes     Alcohol/week: 1.0 standard drink of alcohol     Types: 1 Glasses of wine per week    Drug use: Yes     Types: Other     Comment: Takes Ativan and Ambien. Never used street drugs.    Sexual activity: Not on file   Other Topics Concern    Not on file   Social History Narrative    Not on file     Social Determinants of Health     Financial Resource Strain: Low Risk  (12/04/2022)    Received from [facility], [facility]    Financial Resource Strain     Difficulty Paying Living Expenses: Not hard at all     Difficulty Paying Medical Expenses: No   Food Insecurity: No  Food Insecurity (12/04/2022)    Received from [facility], [facility]    Food Insecurity     Worried about Running Out of Food in the Last Year: Never true     Ran Out of Food in the Last Year: Never true   Transportation Needs: No Transportation Needs (12/04/2022)    Received from [facility], [facility]    Transportation Needs     Lack of Transportation: No   Physical Activity: Sufficiently Active (12/04/2022)    Received from [facility], [facility]    Physical Activity     Days of Exercise per Week: 5     Minutes of Exercise per Session: 60     Total Minutes of Exercise per Week: 300   Stress: No Stress Concern Present (12/04/2022)    Received from [facility], [facility]    Stress     Feeling of Stress : Only a little   Social Connections: Moderately Integrated (12/04/2022)    Received from [facility], [facility]    Social Connections     Frequency of Communication with Friends and Family: More than three times a week     Frequency of Social Gatherings with Friends and Family: Once a week   Intimate Partner Violence: Unknown (12/01/2021)    Received from [facility], [facility]    Intimate Partner Violence     Fear of Current or Ex-Partner: Not asked     Emotionally Abused: Not asked     Physically Abused: Not asked     Sexually Abused: Not asked   Housing Stability: Not At Risk (12/04/2022)    Received from [facility], [facility]    Housing Stability     Was there a time when you did not have a steady place to sleep: No     Worried that the place you are staying is making you sick: No     Current Outpatient Medications   Medication Sig Dispense Refill    montelukast (SINGULAIR) 10 MG tablet Take 1 tablet by mouth nightly      hydroxychloroquine (PLAQUENIL) 200 MG tablet Take 1 and  1/2 pill once a day after food. 135 tablet 1    Denosumab (PROLIA SC) Inject into the skin #3 given on 04/03/23.      Multiple Vitamins-Minerals (CENTRUM ADULT PO) Take by mouth      Cholecalciferol  (VITAMIN D3) 125 MCG (5000 UT) TABS Take 1 tablet by mouth daily      melatonin 3 MG TABS tablet Take 1 tablet by mouth daily      amLODIPine (NORVASC) 10 MG tablet Take 1 tablet by mouth daily      EPINEPHrine (EPIPEN) 0.3 MG/0.3ML SOAJ injection epinephrine 0.3 mg/0.3 mL injection, auto-injector   INJECT 0.3 ML (0.3 MG) INTO THE MUSCLE ONCE AS NEEDED FOR ANAPHYLAXIS FOR UP TO 1 DOSE      fexofenadine (ALLEGRA ALLERGY) 180 MG tablet Take 1 tablet by mouth daily      famotidine (PEPCID) 20 MG tablet Take 1 tablet by mouth daily      LORazepam (ATIVAN) 1 MG tablet Take 1 tablet by mouth 2 times daily as needed.      zolpidem (AMBIEN) 5 MG tablet zolpidem 5 mg tablet   TAKE 1 TABLET BY MOUTH NIGHTLY      Magnesium 300 MG CAPS 1 capsule with a meal Orally Once a day for 30 day(s)      cycloSPORINE (RESTASIS) 0.05 % ophthalmic emulsion Restasis 0.05 % eye drops in a dropperette   INSTILL 1 DROP INTO BOTH EYES TWICE A DAY      linaclotide (LINZESS) 290 MCG CAPS capsule Take 1 capsule by mouth daily (Patient not taking: Reported on 08/05/2023)       No current facility-administered medications for this visit.       OBJECTIVE:  BP (!) 140/83   Pulse 68   Temp 97.9 F (36.6 C) (Oral)   Resp 16   Ht 1.524 m (5')   Wt 59 kg (130 lb)   SpO2 98%   BMI 25.39 kg/m   Pain Score:   0 - No pain (fatigue- has been under the weather, fighting a respiratory infection)     Physical Exam:  Constitutional: Well developed, well nourished female in no acute distress, sitting comfortably on the examination table.    HEENT: Normocephalic and atraumatic. Sclerae anicteric. Neck supple without JVD. No thyromegaly present.    Skin Warm and dry.  No bruising and no rash noted.  No erythema.  No pallor.    Respiratory Lungs are clear to auscultation bilaterally without wheezes, rales or rhonchi, normal air exchange without accessory muscle use.    CVS Normal rate, regular rhythm and normal S1 and S2.  No murmurs, gallops, or rubs.   Neuro  Grossly nonfocal with no obvious sensory or motor deficits.   MSK Normal range of motion in general.  No edema and no tenderness.   Psych Appropriate mood and affect.      Labs:  Recent Results (from the past 24 hour(s))   CBC with Auto Differential    Collection Time: 08/05/23 11:27 AM   Result Value Ref Range    WBC 7.2 4.3 - 11.1 K/uL    RBC 4.80 4.05 - 5.2 M/uL    Hemoglobin 14.4 11.7 - 15.4 g/dL    Hematocrit 09.8 11.9 - 46.3 %    MCV 89.0 82.0 - 102.0 FL    MCH 30.0 26.1 - 32.9 PG    MCHC 33.7 31.4 - 35.0 g/dL    RDW 14.7 82.9 - 56.2 %  Platelets 315 150 - 450 K/uL    MPV 8.8 (L) 9.4 - 12.3 FL    nRBC 0.00 0.0 - 0.2 K/uL    Neutrophils % 69.0 43.0 - 78.0 %    Lymphocytes % 23.9 13.0 - 44.0 %    Monocytes % 5.2 4.0 - 12.0 %    Eosinophils % 1.0 0.5 - 7.8 %    Basophils % 0.6 0.0 - 2.0 %    Immature Granulocytes % 0.3 0.0 - 5.0 %    Neutrophils Absolute 4.95 1.70 - 8.20 K/UL    Lymphocytes Absolute 1.71 0.50 - 4.60 K/UL    Monocytes Absolute 0.37 0.10 - 1.30 K/UL    Eosinophils Absolute 0.07 0.00 - 0.80 K/UL    Basophils Absolute 0.04 0.00 - 0.20 K/UL    Immature Granulocytes Absolute 0.02 0.00 - 0.50 K/UL    Differential Type AUTOMATED     Comprehensive Metabolic Panel    Collection Time: 08/05/23 11:27 AM   Result Value Ref Range    Sodium 134 (L) 136 - 145 mmol/L    Potassium 3.8 3.5 - 5.1 mmol/L    Chloride 98 98 - 107 mmol/L    CO2 26 20 - 29 mmol/L    Anion Gap 10 7 - 16 mmol/L    Glucose 77 70 - 99 mg/dL    BUN 19 6 - 23 MG/DL    Creatinine 1.61 0.96 - 1.10 MG/DL    Est, Glom Filt Rate 88 >60 ml/min/1.41m2    Calcium 9.8 8.8 - 10.2 MG/DL    Total Bilirubin 0.2 0.0 - 1.2 MG/DL    ALT 38 8 - 45 U/L    AST 33 15 - 37 U/L    Alk Phosphatase 44 35 - 104 U/L    Total Protein 7.8 6.3 - 8.2 g/dL    Albumin 4.3 3.5 - 5.0 g/dL    Globulin 3.5 2.3 - 3.5 g/dL    Albumin/Globulin Ratio 1.2 1.0 - 1.9     Iron and TIBC    Collection Time: 08/05/23 11:27 AM   Result Value Ref Range    Iron 90 35 - 100 ug/dL    TIBC 045  409 - 811 ug/dL    Iron % Saturation 35 20 - 50 %    UIBC 167.0 112.0 - 347.0 ug/dL   Ferritin    Collection Time: 08/05/23 11:27 AM   Result Value Ref Range    Ferritin 392 (H) 8 - 388 NG/ML       Imaging:  No results found.    ASSESSMENT:   Diagnosis Orders   1. Iron deficiency anemia, unspecified iron deficiency anemia type          Patient Active Problem List   Diagnosis    EBV infection    Lymphadenopathy    Osteopenia of multiple sites    Iron deficiency anemia           PLAN:  Lab studies were personally reviewed.    Pertinent old records were reviewed from [facility]    IDA: treated in the past with IV iron at [facility], most recently July 2023 with subsequent improvement.  Recent CBC in February 2024 showed Hgb of 15.1 with normal MCV, no iron studies performed at that time.      She returns today for follow up for IDA.  Since last seen she received Infed in November and reports improved energy level since then and the resolution of ice cravings.  There has been no obvious bleeding.  She is currently recovering from a URI.  Labs reviewed, Hgb 14.4, ferritin up to 392, Fe sat 35% . There is no indication for IV iron and we will continue to monitor.  She will return for repeat labs and follow up in 6 months or sooner.  All questions were asked and answered to the best of my ability.          Joaquin Bend) Decarlos Empey, [facility]  [facility]  [address]  [place], [place] [address]  Office : [phone]  Fax : [phone]

## 2023-09-09 ENCOUNTER — Ambulatory Visit
Admit: 2023-09-09 | Discharge: 2023-09-09 | Payer: PRIVATE HEALTH INSURANCE | Attending: Rheumatology | Primary: Family Medicine

## 2023-09-09 ENCOUNTER — Encounter

## 2023-09-09 VITALS — BP 102/80 | HR 70 | Ht 60.0 in | Wt 124.6 lb

## 2023-09-09 DIAGNOSIS — M359 Systemic involvement of connective tissue, unspecified: Secondary | ICD-10-CM

## 2023-09-09 LAB — VITAMIN D 25 HYDROXY: Vit D, 25-Hydroxy: 35.3 ng/mL (ref 30.0–100.0)

## 2023-09-09 LAB — C-REACTIVE PROTEIN: CRP: <0.3 mg/dL (ref 0.0–0.4)

## 2023-09-09 MED ORDER — HYDROXYCHLOROQUINE SULFATE 200 MG PO TABS
200 | ORAL_TABLET | ORAL | 1 refills | Status: AC
Start: 2023-09-09 — End: ?

## 2023-09-09 NOTE — Progress Notes (Signed)
 [facility]  Loreen Freud, M.D.  [address], [address]   [place], [id number]  Office : [phone], Fax: [phone]     RHEUMATOLOGY OFFICE VISIT NOTE  Date of Visit:  09/09/2023 9:25 AM    Patient Information:  Name:  Danielle Powell  DOB:  1966/02/15  Age:  58 y.o.   Gender:  female      Danielle Powell is here today for follow-up of undifferentiated connective tissue disease, osteopenia and medication monitoring.     Last visit: 06/10/23    History of Present Illness: On talking to patient today she states that she had a cough which lasted for [redacted] weeks along with a fever that lasted for 2 days.  She was treated with 1 round of an antibiotic and was told to use her inhaler as needed.  Currently she denies having any shortness of breath with no chest pain.  Patient's last eye exam from 04/26/23 at [facility], did not show any evidence of Plaquenil toxicity.  Her current joint complaints are as mentioned below.    Since the last visit, patient is feeling "good".    Pain: 5/10  Location: Bilateral ankle and shoulder pain worse in the right than the left. Occasional tension headaches with some para spinal muscle pain.  Some right elbow pain worse than the left elbow pain. Bilateral wrist stiffness with no swelling. Bilateral ankle pain and swelling with no buckling of the ankles. Some pain in her feet with swelling with no warmth and redness. Bilateral hip and knee pain with swelling medially but not laterally. No buckling fo the knees.   Quality:  Deep achy pain.   Modifying Factors:  End of the day the pain is the worst.   Associated Symptoms:  No limitations with her ADL's with a weak grip. No tingling or numbness or pain down the arms or legs with intermittent tingling and numbness from the shoulders to the fingertips with proximal arm pain.         09/09/2023     8:00 AM   DMARD/Biologic   AM Stiffness 15 min   Pain 5   Fatigue 10   MDHAQ 0   Patient Global Score 2    Medication Name Plaquenil     Last TB screen: Years ago  TB result: Always been negative     Current dose of steroids:none  How long on current dose of steroids:NA  How long on continuous steroid therapy:NA     Past DMARDs, if applicable (methotrexate, plaquenil/hydroxychloroquine, sulfasalazine, Arava/leflunomide): Currently taking Plaquenil 300 mg QD.      Past biologics, if applicable (enbrel, humira, simponi, cimzia, Harriette Ohara, [place], remicade, simponi aria, actemra, rituximab, [place], stelara, cosentyx): Never taken any of these    Past NSAIDs, if applicable (motrin, aleve, naproxen, advil, ibuprofen, celebrex, voltaren/diclofenac, etc.):none due to angioedema     Last BMD: 11/17/21   Past osteoporosis drugs, if applicable (fosamax, actonel, boniva, reclast, prolia, forteo):Forteo in the past.  Currently on Prolia #3 administered to her on 04/03/23.      BMI:26.40  Current exercise regimen, if any:gym 5x weekly  Current vitamin D dose: Vit D3- 5000 IU 2x weekly and one MV qd  Current calcium dose: MV 2x weekly and in her foods  Fractures since last visit, if any: none    The patient otherwise has no significant interval changes in health or medical history to report.     History Reviewed:  Past Medical History  Past Medical History:   Diagnosis Date    ADD (attention deficit disorder)     ANA positive     Anti-RNP antibodies present     Arthritis     Depression     Endometriosis     Fibromyalgia     Generalized anxiety disorder     Hypertension     Hyperthyroidism     Irritable bowel syndrome     Lupus     Mononucleosis     Primary insomnia     Vertigo        Past Surgical History  Past Surgical History:   Procedure Laterality Date    CHOLECYSTECTOMY         Family History  Family History   Problem Relation Age of Onset    Hypertension Mother     Diabetes Mother     Cancer Mother     Heart Disease Father     Mult Sclerosis Sister     Heart Disease Other     Alzheimer's Disease Other        Social  History  Social History     Socioeconomic History    Marital status: Married     Spouse name: None    Number of children: None    Years of education: None    Highest education level: None   Tobacco Use    Smoking status: Never     Passive exposure: Never    Smokeless tobacco: Never   Substance and Sexual Activity    Alcohol use: Yes     Alcohol/week: 1.0 standard drink of alcohol     Types: 1 Glasses of wine per week    Drug use: Yes     Types: Other     Comment: Takes Ativan and Ambien. Never used street drugs.     Social Drivers of Psychologist, prison and probation services Strain: Low Risk  (09/04/2023)    Received from [facility]    Financial Resource Strain     Difficulty Paying Living Expenses: Not hard at all     Difficulty Paying Medical Expenses: No   Food Insecurity: No Food Insecurity (09/04/2023)    Received from [facility]    Food Insecurity     Worried about Running Out of Food in the Last Year: Never true     Ran Out of Food in the Last Year: Never true   Transportation Needs: No Transportation Needs (09/04/2023)    Received from [facility] Needs     Lack of Transportation: No   Physical Activity: Sufficiently Active (12/04/2022)    Received from [facility]    Physical Activity     Days of Exercise per Week: 5 days     On average, how many minutes do you exercise per day?: 60     Total Minutes of Exercise Per Week: 300   Stress: No Stress Concern Present (09/04/2023)    Received from [facility]    Stress     Feeling of Stress : Only a little   Social Connections: Moderately Integrated (09/04/2023)    Received from [facility]    Social Connections     Frequency of Communication with Friends and Family: More than three times a week     Frequency of Social Gatherings with Friends and Family: Once a week   Intimate Partner Violence: Unknown (12/01/2021)    Received from [facility]    Intimate Partner Violence     Fear of Current or Ex-Partner: Not asked     Emotionally  Abused: Not asked     Physically Abused: Not asked     Sexually Abused: Not asked   Housing Stability: Not At Risk (09/04/2023)    Received from [facility] Stability     Was there a time when you did not have a steady place to sleep: No     Worried that the place you are staying is making you sick: No               Allergy:  Allergies   Allergen Reactions    Baloxavir Marboxil Hallucinations     Other Reaction(s): Hallucinations, Hallucinations-Intolerance    Blue Dyes (Parenteral) Anaphylaxis and Angioedema     Other Reaction(s): Angioedema-Allergy    Ibuprofen Swelling     swelling      Nsaids Swelling     Other Reaction(s): Angioedema-Allergy    Red Dye #40 (Allura Red) Anaphylaxis, Angioedema and Hives     Other Reaction(s): Angioedema    Other Swelling     Medication not listed in Epic for Influenza    Other Reaction(s): Joint swelling-Intolerance      Medication not listed in Epic for Influenza    Prochlorperazine Rash     Other Reaction(s): Rash-Allergy    Prochlorperazine Edisylate Rash     Current Medications:  Outpatient Encounter Medications as of 09/09/2023   Medication Sig Dispense Refill    amLODIPine (NORVASC) 5 MG tablet Take 1 tablet by mouth daily      hydroxychloroquine (PLAQUENIL) 200 MG tablet Take 1 and 1/2 pill once a day after food. 135 tablet 1    montelukast (SINGULAIR) 10 MG tablet Take 1 tablet by mouth nightly      Denosumab (PROLIA SC) Inject into the skin #3 given on 04/03/23.      Multiple Vitamins-Minerals (CENTRUM ADULT PO) Take by mouth      Cholecalciferol (VITAMIN D3) 125 MCG (5000 UT) TABS Take 1 tablet by mouth Twice a Week      melatonin 3 MG TABS tablet Take 1 tablet by mouth daily      EPINEPHrine (EPIPEN) 0.3 MG/0.3ML SOAJ injection epinephrine 0.3 mg/0.3 mL injection, auto-injector   INJECT 0.3 ML (0.3 MG) INTO THE MUSCLE ONCE AS NEEDED FOR ANAPHYLAXIS FOR UP TO 1 DOSE      fexofenadine (ALLEGRA ALLERGY) 180 MG tablet Take 1 tablet by mouth daily       famotidine (PEPCID) 20 MG tablet Take 1 tablet by mouth daily      LORazepam (ATIVAN) 1 MG tablet Take 1 tablet by mouth 2 times daily as needed.      zolpidem (AMBIEN) 5 MG tablet zolpidem 5 mg tablet   TAKE 1 TABLET BY MOUTH NIGHTLY      Magnesium 300 MG CAPS 1 capsule with a meal Orally Once a day for 30 day(s)      cycloSPORINE (RESTASIS) 0.05 % ophthalmic emulsion Restasis 0.05 % eye drops in a dropperette   INSTILL 1 DROP INTO BOTH EYES TWICE A DAY      [DISCONTINUED] hydroxychloroquine (PLAQUENIL) 200 MG tablet Take 1 and 1/2 pill once a day after food. 135 tablet 1    [DISCONTINUED] linaclotide (LINZESS) 290 MCG CAPS capsule Take 1 capsule by mouth daily (Patient not taking: Reported on 08/05/2023)      [DISCONTINUED] amLODIPine (NORVASC)  10 MG tablet Take 1 tablet by mouth daily       No facility-administered encounter medications on file as of 09/09/2023.           REVIEW OF SYSTEMS: The following systems were reviewed with patient today and were negative except for the following (depicted with an "X"):        "X" General  "X" Head and Neck  "X" Heart and Breathing  "X" Gastrointestinal    Fever/chills  x Hair loss   Shortness of breath  x Upset stomach    Falls  x Dry mouth   Coughing  x Diarrhea / constipation    Wt loss   Mouth sores   Wheezing   Heartburn    Wt gain   Ringing ears   Chest pain   Dark or bloody stools   x Night sweats   Diff. swallowing  X None of above  x Nausea or vomiting    None of above   None of above      None of above                "X" Skin  "X" Neurology  "X" Urinary/Gyn  "X" Other    Easy bruising  x Numbness/ tingling   Female problems   Depression   x Rashes  x Weakness   Problems with urination   Feeling anxious   x Sun sensitivity  x Headaches  X None of above  x Problems sleeping    None of above   None of above      None of above     Physical Exam:  Blood pressure 102/80, pulse 70, height 1.524 m (5'), weight 56.5 kg (124 lb 9.6 oz), SpO2 96%.  General:  Patient alert,  cooperative and in no apparent distress.  HEENT: Pupils equally reactive to light and accommodation, no scleral injection noted.  Neck supple, no lymphadenopathy, no thyromegaly.  Heart: Regular rate and rhythm, normal S1 and S2, no rubs or gallops.  Lungs: Clear to auscultation bilaterally.  Abdomen: Soft, nontender, no hepatosplenomegaly.  Skin:  No rashes. No nail abnormalities.  Neurologic:  Oriented, normal speech and affect.  Normal gait.    Extremities:  No edema in bilateral lower extremities with no cyanosis or clubbing.    Muskoskeletal Exam:     I examined the shoulders, elbows, wrists, MCPs, PIPs, DIPs and knees bilaterally for strength, range of motion, deformity, tenderness, swelling, and synovitis.      The findings are:           Physical Exam              Joint Exam 09/09/2023        Right  Left   Glenohumeral   Tender   Tender   Elbow   Tender   Tender   PIP 2 (finger)   Tender   Tender   PIP 3 (finger)   Tender   Tender   PIP 4 (finger)   Tender   Tender   PIP 5 (finger)   Tender   Tender   Cervical Spine   Tender      Thoracic Spine   Tender      Lumbar Spine   Tender      Knee   Tender   Tender   Ankle   Tender   Tender   Tarsometatarsal   Tender   Tender     Patient otherwise has a normal  joint exam without other evidence of joint tenderness, synovitis, warmth, erythema, decreased ROM, weakness or deformities.     Radiology Reports Reviewed (if available):  Last 3 months     XR LUMBAR SPINE (2-3 VIEWS)  Narrative: EXAM: XR LUMBAR SPINE (2-3 VIEWS)  INDICATION: Low back pain, unspecified; Other chronic pain  COMPARISON: None.    TECHNIQUE: Lumbar Spine AP/LAT imaging was obtained.     FINDINGS:  Straightening of the normal lumbar lordosis. Vertebral body heights are  preserved. No radiographic evidence of fracture. Mild discogenic degenerative  change of the visualized spine. Bone mineralization and soft tissues are within  normal limits. Cholecystectomy clips.  Impression: Mild  spondylosis.    Electronically signed by Lavell Luster         Lab Reports Reviewed (if available): Last 3 months    Hospital Outpatient Visit on 08/05/2023   Component Date Value Ref Range Status    WBC 08/05/2023 7.2  4.3 - 11.1 K/uL Final    RBC 08/05/2023 4.80  4.05 - 5.2 M/uL Final    Hemoglobin 08/05/2023 14.4  11.7 - 15.4 g/dL Final    Hematocrit 74/25/9563 42.7  35.8 - 46.3 % Final    MCV 08/05/2023 89.0  82.0 - 102.0 FL Final    MCH 08/05/2023 30.0  26.1 - 32.9 PG Final    MCHC 08/05/2023 33.7  31.4 - 35.0 g/dL Final    RDW 87/56/4332 12.2  11.9 - 14.6 % Final    Platelets 08/05/2023 315  150 - 450 K/uL Final    MPV 08/05/2023 8.8 (L)  9.4 - 12.3 FL Final    nRBC 08/05/2023 0.00  0.0 - 0.2 K/uL Final    **Note: Absolute NRBC parameter is now reported with Hemogram**    Neutrophils % 08/05/2023 69.0  43.0 - 78.0 % Final    Lymphocytes % 08/05/2023 23.9  13.0 - 44.0 % Final    Monocytes % 08/05/2023 5.2  4.0 - 12.0 % Final    Eosinophils % 08/05/2023 1.0  0.5 - 7.8 % Final    Basophils % 08/05/2023 0.6  0.0 - 2.0 % Final    Immature Granulocytes % 08/05/2023 0.3  0.0 - 5.0 % Final    Neutrophils Absolute 08/05/2023 4.95  1.70 - 8.20 K/UL Final    Lymphocytes Absolute 08/05/2023 1.71  0.50 - 4.60 K/UL Final    Monocytes Absolute 08/05/2023 0.37  0.10 - 1.30 K/UL Final    Eosinophils Absolute 08/05/2023 0.07  0.00 - 0.80 K/UL Final    Basophils Absolute 08/05/2023 0.04  0.00 - 0.20 K/UL Final    Immature Granulocytes Absolute 08/05/2023 0.02  0.00 - 0.50 K/UL Final    Differential Type 08/05/2023 AUTOMATED    Final    Sodium 08/05/2023 134 (L)  136 - 145 mmol/L Final    Potassium 08/05/2023 3.8  3.5 - 5.1 mmol/L Final    Chloride 08/05/2023 98  98 - 107 mmol/L Final    CO2 08/05/2023 26  20 - 29 mmol/L Final    Anion Gap 08/05/2023 10  7 - 16 mmol/L Final    Glucose 08/05/2023 77  70 - 99 mg/dL Final    Comment: <95 mg/dL Consistent with, but not fully diagnostic of hypoglycemia.  100 - 125 mg/dL Impaired  fasting glucose/consistent with pre-diabetes mellitus.  > 126 mg/dl Fasting glucose consistent with overt diabetes mellitus      BUN 08/05/2023 19  6 - 23 MG/DL Final    Creatinine  08/05/2023 0.79  0.60 - 1.10 MG/DL Final    Est, Glom Filt Rate 08/05/2023 88  >60 ml/min/1.57m2 Final    Comment:    Pediatric calculator link: https://www.kidney.org/professionals/kdoqi/gfr_calculatorped     These results are not intended for use in patients <64 years of age.     eGFR results are calculated without a race factor using  the 2021 CKD-EPI equation. Careful clinical correlation is recommended, particularly when comparing to results calculated using previous equations.  The CKD-EPI equation is less accurate in patients with extremes of muscle mass, extra-renal metabolism of creatinine, excessive creatine ingestion, or following therapy that affects renal tubular secretion.      Calcium 08/05/2023 9.8  8.8 - 10.2 MG/DL Final    Total Bilirubin 08/05/2023 0.2  0.0 - 1.2 MG/DL Final    ALT 47/82/9562 38  8 - 45 U/L Final    AST 08/05/2023 33  15 - 37 U/L Final    Alk Phosphatase 08/05/2023 44  35 - 104 U/L Final    Total Protein 08/05/2023 7.8  6.3 - 8.2 g/dL Final    Albumin 13/01/6577 4.3  3.5 - 5.0 g/dL Final    Globulin 46/96/2952 3.5  2.3 - 3.5 g/dL Final    Albumin/Globulin Ratio 08/05/2023 1.2  1.0 - 1.9   Final    Iron 08/05/2023 90  35 - 100 ug/dL Final    TIBC 84/13/2440 257  240 - 450 ug/dL Final    Iron % Saturation 08/05/2023 35  20 - 50 % Final    UIBC 08/05/2023 167.0  112.0 - 347.0 ug/dL Final    Ferritin 04/21/2535 392 (H)  8 - 388 NG/ML Final     The results above were reviewed and discussed with patient.       Assessment/Plan:   Emersyn Wyss is a 58 y.o. female who presents with:     Undifferentiated connective tissue disease: Patient was instructed to continue Plaquenil 200 mg 1 and 1/2 pills to be taken once a day after food.  Patient's last eye exam from 04/26/2023 did not show any evidence of  Plaquenil toxicity.  While on Plaquenil patient is aware that she would need to keep up with yearly eye exams.  With this overall pain I might consider starting her on duloxetine on her follow-up visit with me but for now I did instruct her to take over-the-counter Tylenol arthritis 650 mg 1 pill 4 times a day as needed [maximum dose being that] OR Tylenol Extra Strength 500 mg 2 pills 3 times a day as needed [maximum dose being that].  -     C-Reactive Protein; Future  -     hydroxychloroquine (PLAQUENIL) 200 MG tablet; Take 1 and 1/2 pill once a day after food.    Osteopenia of multiple sites:  Since patient did have her last DEXA scan on 11/17/2021 and her last Prolia shot was administered on 04/03/2023 she will be due for her next Prolia shot on or after 10/02/2023.  Patient is due for her repeat DEXA scan to be done on or after 11/18/2023 which has been put in by her PCP.  Patient was instructed to continue vitamin D 5000 IUs two times a week along with a multivitamin 1 pill to be taken once a day.  Prior to her next Prolia shot I will like to check a vitamin D 25-hydroxy level and will notify the patient if she needs to change the dose of her vitamin D for any  reason, based on today's labs.  -     Vitamin D 25 Hydroxy; Future    Long-term use of high-risk medication: Since patient did have a CBC and a CMP done 08/05/2023 that was reviewed by me with her I did not feel the need to repeat those labs again today.    Disease activity plan:  As stated above.    Steroid management plan:  As stated above, if applicable.    Pain management plan:  As stated above, if applicable.    Weight management plan:  Weight loss through diet and exercise is always encouraged    Disease prognosis: Good    I appreciate the opportunity to continue to participate in the care of this patient.     Follow-up and Dispositions    Return in about 5 months (around 02/06/2024).       Electronically signed by:  Loreen Freud, MD      This note  was dictated using dragon voice recognition software.  It has been proofread, but there may still exist voice recognition errors that the author did not detect.                --------------------------------------------------------------------------------------------------------------------------------------------------------------------------------------------------------------------------------

## 2023-09-10 NOTE — Telephone Encounter (Signed)
 Prolia due 10/02/23. Last given 04/03/23. Not scheduled yet. Previous PA expired 09/09/23.   PHONE CALL to [phone] [facility] Clinical request line. Entered all requested information and then line was disconnected.   PHONE CALL to [phone]. [facility]. Waited on hold and when call was answered it was disconnected.   Called back and spoke with [name].   If billed amount is more than $5000, PA is required. Less than $5000, than no PA or Pre-d is required.   [phone]

## 2023-09-10 NOTE — Telephone Encounter (Signed)
-----   Message from Eveline H sent at 09/03/2023  7:53 AM EDT -----  Regarding: Renew PA Prolia  2025 - Prolia - UMR - PA req. -ded 1800.0 has met ded - OOP 5500.00 met 801.46 Rem. $4696.29 co-ins 20%. 08/29/2023 eh  Prolia and administration will be covered at 100%. No deductible, coinsurance or out of pocket max applies. and are the patient's In-Network benefits. If you would like Pharmacy Benefits, please call [phone].  Yes - PA REQ: Please ensure your patient meets medical necessity by reviewing Medical Policy [id number] at [email or web address], and obtaining a pre-determination by contacting the PA dept at [phone].  bif in verifaction 08/29/23 eh/ bif rec. 09/03/2023 eh   Has co-pay card

## 2023-09-18 NOTE — Telephone Encounter (Signed)
PHONE CALL to patient to schedule the Prolia injection. LEFT MESSAGE on her VM to call me back.

## 2023-09-20 NOTE — Telephone Encounter (Signed)
 Returning patient's call to schedule the Prolia.Scheduled her for 10/02/23.   Patient plans to call [facility] to schedule her BMD. Last one was 11/17/21.

## 2023-09-23 NOTE — Telephone Encounter (Signed)
 Dr MM's pt. Next ov 02/10/24 and last ov was 09/09/23.

## 2023-10-02 ENCOUNTER — Ambulatory Visit: Admit: 2023-10-02 | Discharge: 2023-10-02 | Payer: PRIVATE HEALTH INSURANCE | Primary: Family Medicine

## 2023-10-02 VITALS — BP 139/93 | HR 65 | Temp 97.40000°F

## 2023-10-02 DIAGNOSIS — M8589 Other specified disorders of bone density and structure, multiple sites: Secondary | ICD-10-CM

## 2023-10-02 MED ORDER — DIPHENHYDRAMINE HCL 50 MG/ML IJ SOLN
50 | Freq: Once | INTRAMUSCULAR | Status: DC | PRN
Start: 2023-10-02 — End: 2023-10-02

## 2023-10-02 MED ORDER — EPINEPHRINE PF 1 MG/ML IJ SOLN
1 | INTRAMUSCULAR | Status: DC | PRN
Start: 2023-10-02 — End: 2023-10-02

## 2023-10-02 MED ORDER — ALBUTEROL SULFATE HFA 108 (90 BASE) MCG/ACT IN AERS
108 | RESPIRATORY_TRACT | Status: DC | PRN
Start: 2023-10-02 — End: 2023-10-02

## 2023-10-02 MED ORDER — SODIUM CHLORIDE 0.9 % IV SOLN
0.9 | INTRAVENOUS | Status: DC
Start: 2023-10-02 — End: 2023-10-02

## 2023-10-02 MED ORDER — FAMOTIDINE (PF) 20 MG/2ML IV SOLN
20 | Freq: Once | INTRAVENOUS | Status: DC | PRN
Start: 2023-10-02 — End: 2023-10-02

## 2023-10-02 MED ORDER — HYDROCORTISONE SOD SUC (PF) 100 MG IJ SOLR
100 | Freq: Once | INTRAMUSCULAR | Status: DC | PRN
Start: 2023-10-02 — End: 2023-10-02

## 2023-10-02 MED ORDER — ACETAMINOPHEN 325 MG PO TABS
325 | Freq: Once | ORAL | Status: DC | PRN
Start: 2023-10-02 — End: 2023-10-02

## 2023-10-02 MED ORDER — ONDANSETRON HCL 4 MG/2ML IJ SOLN
4 | Freq: Once | INTRAMUSCULAR | Status: DC | PRN
Start: 2023-10-02 — End: 2023-10-02

## 2023-10-02 MED ORDER — DENOSUMAB 60 MG/ML SC SOSY
60 | Freq: Once | SUBCUTANEOUS | Status: AC
Start: 2023-10-02 — End: 2023-10-02
  Administered 2023-10-02: 18:00:00 60 mg via SUBCUTANEOUS

## 2023-10-02 NOTE — Progress Notes (Signed)
 [facility]  [address]  [place], [place] [address]  Office : [phone], Fax: [phone]       #4 Prolia 60mg /ml injected SC today into left arm. Patient tolerated injection well. Advised patient to continue with calcium and vitamin D post injection. Advised patient to call with any problems post injection.   Medication ordered by Dr. Chriss Czar    Patient Medication Supplied? no  Patient discharged feeling fine and instructed to call the office with any issues.    Pre-infusion/injection questionairre for osteoporosis    1. Have you had or are you planning any dental work? No    2. Are you taking your calcium and vitamin D? Yes - only Vitamin D    3. When was your last osteoporosis treatment? 04/03/2023    4. Have you had any recent fractures? No    5. Are you currently in skilled nursing or in patient rehab? No

## 2023-11-01 NOTE — Telephone Encounter (Signed)
 Eye note received 10/31/23 from [facility] DOS 10/24/23, it was put in Dr Tobey Forte folder to be  reviewed and signed, then it will be sent to front office be scanned into the chart.

## 2023-12-18 LAB — EXTERNAL UNMAPPED PROCEDURES: Hemoglobin A1C, External: 4.9 % (ref <5.7)

## 2024-02-04 ENCOUNTER — Encounter: Payer: PRIVATE HEALTH INSURANCE | Attending: Internal Medicine | Primary: Family Medicine

## 2024-02-10 ENCOUNTER — Ambulatory Visit
Admit: 2024-02-10 | Discharge: 2024-02-10 | Payer: PRIVATE HEALTH INSURANCE | Attending: Rheumatology | Primary: Family Medicine

## 2024-02-10 VITALS — BP 120/80 | HR 75 | Ht 60.0 in | Wt 121.8 lb

## 2024-02-10 DIAGNOSIS — M359 Systemic involvement of connective tissue, unspecified: Principal | ICD-10-CM

## 2024-02-10 MED ORDER — HYDROXYCHLOROQUINE SULFATE 200 MG PO TABS
200 | ORAL_TABLET | ORAL | 1 refills | 90.00000 days | Status: DC
Start: 2024-02-10 — End: 2024-05-29

## 2024-02-10 NOTE — Progress Notes (Signed)
 [facility]  Gay Bones, M.D.  [address], [address]   [place], [place] -[address]  Office : [phone], Fax: [phone]     RHEUMATOLOGY OFFICE VISIT NOTE  Date of Visit:  02/10/2024 9:06 AM    Patient Information:  Name:  Danielle Powell  DOB:  [birth year]  Age:  58 y.o.   Gender:  female      Danielle Powell is here today for follow-up of undifferentiated connective tissue disease, osteopenia and medication monitoring.     Last visit: 09/09/23    History of Present Illness: On talking to the patient today she states that with regard to her seasonal allergies she alternates taking Zyrtec and Allegra which does seem to help her symptoms.  Patient's last eye exam from 04/26/23 at [facility], did not show any evidence of Plaquenil  toxicity. She thinks that she has had her Plaquenil  testing for 2025.  My MA will call and find out from the same doctor's office.  On talking to her further she states that she did hit her head yesterday on the counter when she raised up, hitting her head which is still hurting along with pain in her ears, jaws, and her eyes. She feels pressure in her eyes but is able to see out of it with no difficulty.     Since the last visit, patient is feeling good.    Pain: 4/10  Location:  Some pain in her head, ears and jaws with some pain with neck ROM.  Some hand and feet with no swelling, warmth and redness of the hands with some pain and swelling on the dorsum with no warmth and redness. Bilateral hip pain with no groin pain. Some lower back pain from an injury at the gym 1 month ago. Bilateral shoulder pain. Some para spinal muscle pain.   Quality:  Deep achy pain.   Modifying Factors:  End of the day the pain is the worst.   Associated Symptoms:  Has been independent with her ADL's. No tingling, numbness or pain down the arms with intermittent tingling and numbness of her hands with intermittent pain, tingling and numbness from the knees to the  toes.        02/10/2024     9:00 AM   DMARD/Biologic   AM Stiffness 30 min   Pain 4   Fatigue 9   MDHAQ 0.1   Patient Global Score 2   Medication Name Plaquenil      Last TB screen: Years ago  TB result: Always been negative     Current dose of steroids:none  How long on current dose of steroids:NA  How long on continuous steroid therapy:NA     Past DMARDs, if applicable (methotrexate, plaquenil /hydroxychloroquine , sulfasalazine, Arava/leflunomide): Currently taking Plaquenil  200 mg 1 and 1/2 pills once a day.      Past biologics, if applicable (enbrel, humira, simponi, cimzia, xeljanz, orencia, remicade, simponi aria, actemra, rituximab, otezla, stelara, cosentyx): Never taken any of these     Past NSAIDs, if applicable (motrin, aleve, naproxen, advil, ibuprofen, celebrex, voltaren/diclofenac, etc.):none due to angioedema     Last BMD: 11/17/21      EXAM: Dual energy x-ray absorptiometry (DEXA) scan    COMPARISON:  None.    INDICATION: : M81.0 Age-related osteoporosis without current pathological fracture I10;    TECHNICAL: Hologic Horizon A [phone]) DXA device was utilized.    AP lumbar spine and AP bilateral proximal femurs were evaluated with a DEXA device.  FINDINGS:  From the bone mineral density measurements of the AP spine and bilateral proximal femurs, the lowest bone mineral density result obtained is obtained from the left femoral neck: 0.524 grams/cm2, T-score: -2.9.  Images on Order [id number]    Image Retrieve    The full-size image has not yet been retrieved from an outside organization. To retrieve, click the link below.  Scan on 11/17/2021: DXA bone density axial skeleton        Exam End: 11/17/21 09:27       Past osteoporosis drugs, if applicable (fosamax, actonel, boniva, reclast, prolia , forteo):Forteo in the past.  Currently on Prolia  #4 administered to her on 10/02/23.      BMI:23.79  Current exercise regimen, if any:gym 5x weekly  Current vitamin D dose: Vit D3- 5000 IU qd and one MV twice  weekly  Current calcium dose: MV 2x weekly and in her foods  Fractures since last visit, if any: none    The patient otherwise has no significant interval changes in health or medical history to report.     History Reviewed:    Past Medical History  Past Medical History:   Diagnosis Date    ADD (attention deficit disorder)     ANA positive     Anti-RNP antibodies present     Arthritis     Depression     Endometriosis     Fibromyalgia     Generalized anxiety disorder     Hypertension     Hyperthyroidism     Irritable bowel syndrome     Lupus     Mononucleosis     Primary insomnia     Vertigo        Past Surgical History  Past Surgical History:   Procedure Laterality Date    CHOLECYSTECTOMY         Family History  Family History   Problem Relation Age of Onset    Hypertension Mother     Diabetes Mother     Cancer Mother     Heart Disease Father     Mult Sclerosis Sister     Heart Disease Other     Alzheimer's Disease Other        Social History  Social History     Socioeconomic History    Marital status: Married     Spouse name: None    Number of children: None    Years of education: None    Highest education level: None   Tobacco Use    Smoking status: Never     Passive exposure: Never    Smokeless tobacco: Never   Substance and Sexual Activity    Alcohol use: Yes     Alcohol/week: 1.0 standard drink of alcohol     Types: 1 Glasses of wine per week    Drug use: Yes     Types: Other     Comment: Takes Ativan and Ambien. Never used street drugs.     Social Drivers of Health     Financial Resource Strain: Not At Risk (12/18/2023)    Received from [facility] - Financial Strain     How hard is it for you to pay for the very basics like food, housing, medical care, and heating? Would you say it is:: Not hard at all   Food Insecurity: No Food Insecurity (12/18/2023)    Received from [facility]    Hunger Vital Sign     Within the  past 12 months, you worried that your food would run out before you got the money to  buy more.: Never true     Within the past 12 months, the food you bought just didn't last and you didn't have money to get more.: Never true   Transportation Needs: Not At Risk (12/18/2023)    Received from [facility] - Transportation     In the past 12 months, has lack of reliable transportation kept you from medical appointments, meetings, work or from getting things needed for daily living?: No   Physical Activity: Sufficiently Active (12/18/2023)    Received from [facility]    Physical Activity     Days of Exercise per Week: 6 days     On average, how many minutes do you exercise per day?: 60     Total Minutes of Exercise Per Week: 360   Stress: No Stress Concern Present (09/04/2023)    Received from [facility]    Stress     Feeling of Stress : Only a little   Social Connections: Not At Risk (12/18/2023)    Received from [facility] - Social Connections     If for any reason you need help with day-to-day activities such as bathing, preparing meals, shopping, managing finances, etc., do you get the help you need?: I don't need any help     How often do you feel lonely or isolated from those around you?: Rarely   Intimate Partner Violence: Unknown (12/01/2021)    Received from [facility]    Intimate Partner Violence     Fear of Current or Ex-Partner: Not asked     Emotionally Abused: Not asked     Physically Abused: Not asked     Sexually Abused: Not asked   Housing Stability: Not At Risk (12/18/2023)    Received from [facility] - Inadequate Housing     Current Living Situation: I have a steady place to live     Think about the place you live. Do you have problems with any of the following?: None of the above     Allergy:  Allergies   Allergen Reactions    Baloxavir Marboxil Hallucinations     Other Reaction(s): Hallucinations, Hallucinations-Intolerance    Blue Dyes (Parenteral) Anaphylaxis and Angioedema     Other Reaction(s): Angioedema-Allergy    Ibuprofen Swelling     swelling       Nsaids Swelling     Other Reaction(s): Angioedema-Allergy    Red Dye #40 (Allura Red) Anaphylaxis, Angioedema and Hives     Other Reaction(s): Angioedema    Other Swelling     Medication not listed in Epic for Influenza    Other Reaction(s): Joint swelling-Intolerance      Medication not listed in Epic for Influenza    Prochlorperazine Rash     Other Reaction(s): Rash-Allergy    Prochlorperazine Edisylate Rash         Current Medications:  Outpatient Encounter Medications as of 02/10/2024   Medication Sig Dispense Refill    hydroxychloroquine  (PLAQUENIL ) 200 MG tablet Take 1 and 1/2 pill once a day after food. 135 tablet 1    amLODIPine (NORVASC) 5 MG tablet Take 1 tablet by mouth daily      montelukast (SINGULAIR) 10 MG tablet Take 1 tablet by mouth nightly      Denosumab  (PROLIA  SC) Inject into the skin #4 given on  10/02/23      Multiple Vitamins-Minerals (CENTRUM ADULT PO) Take by mouth Twice a Week      Cholecalciferol (VITAMIN D3) 125 MCG (5000 UT) TABS Take 1 tablet by mouth daily      melatonin 3 MG TABS tablet Take 1 tablet by mouth daily      EPINEPHrine  (EPIPEN ) 0.3 MG/0.3ML SOAJ injection epinephrine  0.3 mg/0.3 mL injection, auto-injector   INJECT 0.3 ML (0.3 MG) INTO THE MUSCLE ONCE AS NEEDED FOR ANAPHYLAXIS FOR UP TO 1 DOSE      fexofenadine (ALLEGRA ALLERGY) 180 MG tablet Take 1 tablet by mouth daily      famotidine  (PEPCID ) 20 MG tablet Take 1 tablet by mouth daily      LORazepam (ATIVAN) 1 MG tablet Take 1 tablet by mouth 2 times daily as needed.      zolpidem (AMBIEN) 5 MG tablet zolpidem 5 mg tablet   TAKE 1 TABLET BY MOUTH NIGHTLY      Magnesium 300 MG CAPS 1 capsule with a meal Orally Once a day for 30 day(s)      cycloSPORINE (RESTASIS) 0.05 % ophthalmic emulsion Restasis 0.05 % eye drops in a dropperette   INSTILL 1 DROP INTO BOTH EYES TWICE A DAY      [DISCONTINUED] hydroxychloroquine  (PLAQUENIL ) 200 MG tablet Take 1 and 1/2 pill once a day after food. 135 tablet 1     No  facility-administered encounter medications on file as of 02/10/2024.     REVIEW OF SYSTEMS: The following systems were reviewed with patient today and were negative except for the following (depicted with an X):    X General  X Head and Neck  X Heart and Breathing  X Gastrointestinal    Fever/chills  x Hair loss   Shortness of breath  x Upset stomach    Falls  x Dry mouth   Coughing  x Diarrhea / constipation    Wt loss   Mouth sores   Wheezing   Heartburn    Wt gain   Ringing ears   Chest pain   Dark or bloody stools   x Night sweats   Diff. swallowing  X None of above   Nausea or vomiting    None of above   None of above      None of above                X Skin  X Neurology  X Urinary/Gyn  X Other    Easy bruising  x Numbness/ tingling   Female problems   Depression    Rashes  x Weakness   Problems with urination   Feeling anxious    Sun sensitivity  x Headaches  X None of above  x Problems sleeping   X None of above   None of above      None of above     Physical Exam:  Blood pressure 120/80, pulse 75, height 1.524 m (5'), weight 55.2 kg (121 lb 12.8 oz), SpO2 93%.  General:  Patient alert, cooperative and in no apparent distress.  HEENT: Pupils equally reactive to light and accommodation, no scleral injection noted.  Neck supple, no lymphadenopathy, no thyromegaly.  Heart: Regular rate and rhythm, normal S1 and S2, no rubs or gallops.  Lungs: Clear to auscultation bilaterally.  Abdomen: Soft, nontender, no hepatosplenomegaly.  Skin:  No rashes. No nail abnormalities.  Neurologic:  Oriented, normal speech and affect.  Normal gait.  Extremities:  No edema in bilateral lower extremities with no cyanosis or clubbing.    Muskoskeletal Exam:     I examined the shoulders, elbows, wrists, MCPs, PIPs, DIPs and knees bilaterally for strength, range of motion, deformity, tenderness, swelling, and synovitis.      The findings are:       Physical Exam              Joint Exam 02/10/2024        Right  Left    CMC   Tender   Tender   PIP 2 (finger)   Tender   Tender   PIP 3 (finger)   Tender   Tender   PIP 4 (finger)   Tender   Tender   PIP 5 (finger)   Tender   Tender   Cervical Spine   Tender      Lumbar Spine   Tender      Sacroiliac   Tender   Tender   Knee   Tender   Tender   Ankle   Tender   Tender   Tarsometatarsal   Tender   Tender     CDAI: 3.04    Patient otherwise has a normal joint exam without other evidence of joint tenderness, synovitis, warmth, erythema, decreased ROM, weakness or deformities.     Radiology Reports Reviewed (if available):  Last 3 months     XR LUMBAR SPINE (2-3 VIEWS)  Narrative: EXAM: XR LUMBAR SPINE (2-3 VIEWS)  INDICATION: Low back pain, unspecified; Other chronic pain  COMPARISON: None.    TECHNIQUE: Lumbar Spine AP/LAT imaging was obtained.     FINDINGS:  Straightening of the normal lumbar lordosis. Vertebral body heights are  preserved. No radiographic evidence of fracture. Mild discogenic degenerative  change of the visualized spine. Bone mineralization and soft tissues are within  normal limits. Cholecystectomy clips.  Impression: Mild spondylosis.    Electronically signed by Donnice Manus         Lab Reports Reviewed (if available): Last 3 months    No visits with results within 3 Month(s) from this visit.   Latest known visit with results is:   Orders Only on 09/09/2023   Component Date Value Ref Range Status    CRP 09/09/2023 <0.3  0.0 - 0.4 mg/dL Final    Vit D, 74-Ybimnkb 09/09/2023 35.3  30.0 - 100.0 ng/mL Final    Comment: Deficiency         <20.0 ng/mL  Insufficiency       20.0-29.9 ng/mL         The results above were reviewed and discussed with patient.       Assessment/Plan:   Danielle Powell is a 58 y.o. female who presents with:     Undifferentiated connective tissue disease: Patient was instructed to continue Plaquenil  200 mg a pill and a half to be taken once a day after food.  I am in the process of obtaining the most recent eye note from this year.  While on  Plaquenil  patient is aware that she would need to keep up with yearly eye exams.  -     hydroxychloroquine  (PLAQUENIL ) 200 MG tablet; Take 1 and 1/2 pill once a day after food.    Osteoporosis of femur without pathological fracture: I will await the results of the new DEXA scan but based on her last DEXA scan done in 2023 I would like her to continue Prolia  shots every 6 months with her  last shot being administered on 10/02/2023.  She was instructed to continue a multivitamin twice a week along with vitamin D 5000 IUs daily.  With regard to the recent trauma to her head she was directed to go to the ER around the corner at [facility] [address] entrance A to have a CT scan of the head and neck including the sinuses.  -     DEXA BONE DENSITY AXIAL SKELETON; Future    Long-term use of high-risk medication: Since patient did have a sed rate, CRP and CBC with differential that was reviewed by me with the patient I did not feel the need to repeat lab work today.    Disease activity plan:  As stated above.    Steroid management plan:  As stated above, if applicable.    Pain management plan:  As stated above, if applicable.    Weight management plan:  Weight loss through diet and exercise is always encouraged    Disease prognosis: Good    I appreciate the opportunity to continue to participate in the care of this patient.     Follow-up and Dispositions    Return in about 4 months (around 06/11/2024).         Electronically signed by:  Gay Bones, MD      This note was dictated using dragon voice recognition software.  It has been proofread, but there may still exist voice recognition errors that the author did not detect.                --------------------------------------------------------------------------------------------------------------------------------------------------------------------------------------------------------------------------------

## 2024-02-11 NOTE — Telephone Encounter (Signed)
 Both received and exported to Dr. Rupert for review! :)

## 2024-02-11 NOTE — Telephone Encounter (Signed)
 Called today to get patients most recent eye note from [facility] 04/26/23 and 10/24/23 spoke with Rosaline and they will fax it over by end of day.

## 2024-02-17 ENCOUNTER — Encounter

## 2024-02-26 ENCOUNTER — Encounter

## 2024-03-17 ENCOUNTER — Ambulatory Visit
Admit: 2024-03-17 | Discharge: 2024-03-17 | Payer: PRIVATE HEALTH INSURANCE | Attending: Orthopaedic Surgery | Primary: Family Medicine

## 2024-03-17 ENCOUNTER — Ambulatory Visit: Admit: 2024-03-17 | Payer: PRIVATE HEALTH INSURANCE | Primary: Family Medicine

## 2024-03-17 DIAGNOSIS — M25551 Pain in right hip: Principal | ICD-10-CM

## 2024-03-17 NOTE — Progress Notes (Signed)
 Patient ID:    Danielle Powell  [id number]  58 y.o.  [birth year]    Today: March 17, 2024          Chief Complaint: Right hip pain      Patient returns to office today with chief complaint of right hip pain.  The pain is predominately localized to the anterior and is described as a general ache with occasional sharp pain.  They rate the pain ranging from 3-8 on the pain scale occurring in a cyclical fashion with periods of acute exacerbation. Symptoms are  exacerbated with getting into and out of their vehicle, crossing their legs, and attempting to put on socks/shoes. No significant pain noted with laying on the affected hip. No numbness of tingling going down the extremity.  Patient has attempted prior conservative treatment including Activity modification and Tylenol . Previously patient has had moderate relief with OTC Tylenol . She is not able to take NSAIDS because of her autoimmune connective tissue disease.        Past Medical History:  Past Medical History:   Diagnosis Date    ADD (attention deficit disorder)     ANA positive     Anti-RNP antibodies present     Arthritis     Depression     Endometriosis     Fibromyalgia     Generalized anxiety disorder     Hypertension     Hyperthyroidism     Irritable bowel syndrome     Lupus     Mononucleosis     Primary insomnia     Vertigo        Past Surgical History:  Past Surgical History:   Procedure Laterality Date    CHOLECYSTECTOMY          Medications:     Prior to Admission medications   Medication Sig Start Date End Date Taking? Authorizing Provider   hydroxychloroquine  (PLAQUENIL ) 200 MG tablet Take 1 and 1/2 pill once a day after food. 02/10/24   Machimada, Muthamma J, MD   amLODIPine (NORVASC) 5 MG tablet Take 1 tablet by mouth daily    [provider]   montelukast (SINGULAIR) 10 MG tablet Take 1 tablet by mouth nightly    [provider]   Denosumab  (PROLIA  SC) Inject into the skin #4 given on 10/02/23    [provider]    Multiple Vitamins-Minerals (CENTRUM ADULT PO) Take by mouth Twice a Week    [provider]   Cholecalciferol (VITAMIN D3) 125 MCG (5000 UT) TABS Take 1 tablet by mouth daily    [provider]   melatonin 3 MG TABS tablet Take 1 tablet by mouth daily    [provider]   EPINEPHrine  (EPIPEN ) 0.3 MG/0.3ML SOAJ injection epinephrine  0.3 mg/0.3 mL injection, auto-injector   INJECT 0.3 ML (0.3 MG) INTO THE MUSCLE ONCE AS NEEDED FOR ANAPHYLAXIS FOR UP TO 1 DOSE 01/26/16   [provider]   fexofenadine (ALLEGRA ALLERGY) 180 MG tablet Take 1 tablet by mouth daily    [provider]   famotidine  (PEPCID ) 20 MG tablet Take 1 tablet by mouth daily 04/25/16   [provider]   LORazepam (ATIVAN) 1 MG tablet Take 1 tablet by mouth 2 times daily as needed. 05/02/21   [provider]   zolpidem (AMBIEN) 5 MG tablet zolpidem 5 mg tablet   TAKE 1 TABLET BY MOUTH NIGHTLY    [provider]   Magnesium 300 MG CAPS 1  capsule with a meal Orally Once a day for 30 day(s)    [provider]   cycloSPORINE (RESTASIS) 0.05 % ophthalmic emulsion Restasis 0.05 % eye drops in a dropperette   INSTILL 1 DROP INTO BOTH EYES TWICE A DAY    [provider]       Family History:     Family History   Problem Relation Age of Onset    Hypertension Mother     Diabetes Mother     Cancer Mother     Heart Disease Father     Mult Sclerosis Sister     Heart Disease Other     Alzheimer's Disease Other        Social History:      Social History     Tobacco Use    Smoking status: Never     Passive exposure: Never    Smokeless tobacco: Never   Substance Use Topics    Alcohol use: Yes     Alcohol/week: 1.0 standard drink of alcohol     Types: 1 Glasses of wine per week         Allergies:      Allergies   Allergen Reactions    Baloxavir Marboxil Hallucinations     Other Reaction(s): Hallucinations, Hallucinations-Intolerance    Blue Dyes (Parenteral) Anaphylaxis and  Angioedema     Other Reaction(s): Angioedema-Allergy    Ibuprofen Swelling     swelling      Nsaids Swelling     Other Reaction(s): Angioedema-Allergy    Red Dye #40 (Allura Red) Anaphylaxis, Angioedema and Hives     Other Reaction(s): Angioedema    Other Swelling     Medication not listed in Epic for Influenza    Other Reaction(s): Joint swelling-Intolerance      Medication not listed in Epic for Influenza    Prochlorperazine Rash     Other Reaction(s): Rash-Allergy    Prochlorperazine Edisylate Rash          ROS:  Patient Health Form has been filled out, reviewed, signed and included in the chart.  ROS findings related to musculoskeletal problems were discussed with the patient.  Patient advised to discuss non-orthopedic complaints with primary care physician.      Objective:     Vitals:   There were no vitals taken for this visit.    General: Awake and alert. AAOx3.   The patient ambulates with an antalgic gait.  Psych: Mood appropriate  HEENT: Normocephalic, atraumatic  Neck: Supple  CV/Pulm: Breathing even and unlabored  Abdomen: Nondistended  Circulation: Normal without obvious arterial or venous deficiency. Pulses palpable bilateral lower extremities.  Lymphatic: No obvious lymphedema or lymphadenopathy noted.  Skin: No obvious lacerations or rashes noted.  Musculoskeletal: No obvious deformity or pain with active movement of the upper extremities.  Neuro: No obvious deficiency or weakness noted of the upper or lower extremities    Hip Exam:   Exam of the hip reveals mild anterior groin pain with resisted leg raise and FADIR testing. Motion in the hip is normal. No evidence of arterial of venous insufficiency. DP/PT pulses appreciable. Able to plantar- and dorsi-flex the foot/ankle.                Imaging:    Images obtained today or prior      XR HIP RT W OR WO PELVIS 2-3 VWS  Views Obtained: AP pelvis, lateral right hip  Indication: Right Hip Pain  Findings:  Xrays  including A/P Pelvis and lateral of the  hip(s) were reviewed which demonstrate no evidence of significant osteoarthritis. The joint space appears well maintained and there is no significant osteophyte formation or presence of subchondral cysts. No acute fracture/process is appreciated.  Impression: Normal appearing right hip    Ester JINNY Portugal, MD     Assessment:     Hip Pain    Plan:  Differential diagnosis and treatment options have been discussed with the patient. Risks, benefits and alternatives of each were discussed and patient questions answered. We discussed oral anti-inflammatory medications, activity modifications, physical therapy, corticosteroid injections, weight loss (if appropriate), and surgery. At this point I feel the patient is suffering from possible intra-articular pathology of the hip. The patient would like to proceed with referral to physical therapy    Treatment:    Physical Therapy- the patient would like to attempt treatment with formal physical therapy. A prescription/referral is given. The goal of physical therapy is to focus on limb strengthening and stretching. We will attempted 4-6 weeks of therapy. In addition I did recommend the patient begin to ride a stationary or recumbent bicycle on their own. Patient may followup either as directed or as needed.    Signed By: Ester JINNY Portugal, MD  March 17, 2024

## 2024-03-19 ENCOUNTER — Inpatient Hospital Stay: Admit: 2024-03-19 | Payer: PRIVATE HEALTH INSURANCE | Primary: Family Medicine

## 2024-03-19 ENCOUNTER — Encounter

## 2024-03-19 ENCOUNTER — Inpatient Hospital Stay: Payer: PRIVATE HEALTH INSURANCE | Primary: Family Medicine

## 2024-03-19 DIAGNOSIS — Z13820 Encounter for screening for osteoporosis: Principal | ICD-10-CM

## 2024-03-19 DIAGNOSIS — Z1231 Encounter for screening mammogram for malignant neoplasm of breast: Principal | ICD-10-CM

## 2024-03-20 ENCOUNTER — Encounter

## 2024-03-24 ENCOUNTER — Encounter: Payer: PRIVATE HEALTH INSURANCE | Attending: Orthopaedic Surgery | Primary: Family Medicine

## 2024-03-30 ENCOUNTER — Ambulatory Visit: Payer: PRIVATE HEALTH INSURANCE | Primary: Family Medicine

## 2024-04-02 ENCOUNTER — Inpatient Hospital Stay: Admit: 2024-04-02 | Payer: PRIVATE HEALTH INSURANCE | Primary: Family Medicine

## 2024-04-02 ENCOUNTER — Encounter

## 2024-04-02 VITALS — Ht 60.0 in | Wt 122.0 lb

## 2024-04-02 DIAGNOSIS — N6459 Other signs and symptoms in breast: Principal | ICD-10-CM

## 2024-04-15 ENCOUNTER — Inpatient Hospital Stay: Admit: 2024-04-15 | Payer: PRIVATE HEALTH INSURANCE | Primary: Family Medicine

## 2024-04-15 DIAGNOSIS — N6452 Nipple discharge: Principal | ICD-10-CM

## 2024-04-15 MED ORDER — GADOTERIDOL 279.3 MG/ML IV SOLN
279.3 | Freq: Once | INTRAVENOUS | Status: AC | PRN
Start: 2024-04-15 — End: 2024-04-15
  Administered 2024-04-15: 20:00:00 10 mL via INTRAVENOUS

## 2024-04-15 MED FILL — PROHANCE 279.3 MG/ML IV SOLN: 279.3 mg/mL | INTRAVENOUS | Qty: 10

## 2024-04-27 ENCOUNTER — Encounter

## 2024-04-27 NOTE — Telephone Encounter (Signed)
"  Physician provider: Dr. Mertha   Reason for today's call (Please detail here patients chief complaint): patient has an appointment tomorrow but she doesn't have a lab appointment she would like to know if she can be seen today to get her labs drawn.      Last office visit:na  Patient Callback Number: [phone]  Was callback number verified?: Yes  Preferred pharmacy (If refill request): na  Veriified that patient confirmed no refills left at pharmacy? :na  Has the patient called the office for this concern within the last 48 hours?:na    Red Word Mentioned  Warm Transfer Phone Call to (Name): na    Patient notified that their information will be routed to the appropriate team for review. Patient is advised that they will receive a phone call from the appropriate department. If awaiting a call from the triage department and symptoms worsen before receiving a call back, the patient has been advised to proceed to the nearest ED.   "

## 2024-04-27 NOTE — Telephone Encounter (Signed)
"  Called and spoke to patient.  Instructed patient to present to Outreach location for labs today.  Informed appt not needed.  Patient states she will report to the [place] lab.  "

## 2024-04-28 ENCOUNTER — Ambulatory Visit
Admit: 2024-04-28 | Discharge: 2024-04-28 | Payer: PRIVATE HEALTH INSURANCE | Attending: Internal Medicine | Primary: Family Medicine

## 2024-04-28 VITALS — BP 137/96 | HR 75 | Temp 98.50000°F | Resp 22 | Ht 60.0 in | Wt 126.6 lb

## 2024-04-28 DIAGNOSIS — D509 Iron deficiency anemia, unspecified: Principal | ICD-10-CM

## 2024-04-28 LAB — CBC WITH AUTO DIFFERENTIAL
Basophils %: 0.3 % (ref 0.0–2.0)
Basophils Absolute: 0.02 K/UL (ref 0.00–0.20)
Eosinophils %: 0.2 % — ABNORMAL LOW (ref 0.5–7.8)
Eosinophils Absolute: 0.01 K/UL (ref 0.00–0.80)
Hematocrit: 42.6 % (ref 35.8–46.3)
Hemoglobin: 14.3 g/dL (ref 11.7–15.4)
Immature Granulocytes %: 0.2 % (ref 0.0–5.0)
Immature Granulocytes Absolute: 0.01 K/UL (ref 0.0–0.5)
Lymphocytes %: 27.2 % (ref 13.0–44.0)
Lymphocytes Absolute: 1.69 K/UL (ref 0.50–4.60)
MCH: 31.4 pg (ref 26.1–32.9)
MCHC: 33.6 g/dL (ref 31.4–35.0)
MCV: 93.6 FL (ref 82–102)
MPV: 9.3 FL — ABNORMAL LOW (ref 9.4–12.3)
Monocytes %: 6.3 % (ref 4.0–12.0)
Monocytes Absolute: 0.39 K/UL (ref 0.10–1.30)
Neutrophils %: 65.8 % (ref 43.0–78.0)
Neutrophils Absolute: 4.09 K/UL (ref 1.70–8.20)
Platelets: 299 K/uL (ref 150–450)
RBC: 4.55 M/uL (ref 4.05–5.2)
RDW: 11.9 % (ref 11.9–14.6)
WBC: 6.2 K/uL (ref 4.3–11.1)
nRBC: 0 K/uL (ref 0.0–0.2)

## 2024-04-28 LAB — COMPREHENSIVE METABOLIC PANEL
ALT: 65 U/L — ABNORMAL HIGH (ref 8–45)
AST: 39 U/L — ABNORMAL HIGH (ref 15–37)
Albumin/Globulin Ratio: 1.2 (ref 1.0–1.9)
Albumin: 3.8 g/dL (ref 3.5–5.0)
Alk Phosphatase: 51 U/L (ref 35–104)
Anion Gap: 10 mmol/L (ref 7–16)
BUN: 24 mg/dL — ABNORMAL HIGH (ref 6–23)
CO2: 29 mmol/L (ref 20–29)
Calcium: 9.3 mg/dL (ref 8.8–10.2)
Chloride: 98 mmol/L (ref 98–107)
Creatinine: 0.83 mg/dL (ref 0.60–1.10)
Est, Glom Filt Rate: 82 ml/min/1.73m2 (ref >60)
Globulin: 3.3 g/dL (ref 2.3–3.5)
Glucose: 63 mg/dL — ABNORMAL LOW (ref 70–99)
Potassium: 4.4 mmol/L (ref 3.5–5.1)
Sodium: 137 mmol/L (ref 136–145)
Total Bilirubin: 0.2 mg/dL (ref 0.0–1.2)
Total Protein: 7 g/dL (ref 6.3–8.2)

## 2024-04-28 LAB — IRON AND TIBC
Iron % Saturation: 19 % — ABNORMAL LOW (ref 20–50)
Iron: 54 ug/dL (ref 35–100)
TIBC: 279 ug/dL (ref 240–450)
UIBC: 225 ug/dL (ref 112.0–347.0)

## 2024-04-28 LAB — FERRITIN: Ferritin: 242 ng/mL (ref 8–388)

## 2024-04-28 NOTE — Progress Notes (Signed)
 Research Scientist (medical) Hematology and Oncology: Office Visit Established Patient     Chief Complaint:    Chief Complaint   Patient presents with    Follow-up         History of Present Illness:  Danielle Powell is a 58 y.o. female who presents today for follow-up regarding iron  deficiency anemia.  She has been previously followed at [facility] hematology, she is receiving intermittent iron  infusions for IDA, most recently in July 2023.  She saw them in November 2023 and her iron  stores were improving so no treatment was given, she has been intolerant to oral iron  historically.  Of note she has a history of connective tissue disease and is followed by rheumatology.  Most recent labs at primary care showed a Hgb of 15.1 with no evidence of microcytosis, iron  studies were not done at that time.  She transferred to [facility] for evaluation due to the [facility] dispute. Received InFed  in November 2024 with improvement in symptoms and lab parameters.      History of Present Illness  The patient, a 58 year old female, presents for a follow-up evaluation of iron  deficiency anemia, with her last administration of INFeD  occurring in November 2024.    She reports experiencing worsening fatigue, which she attributes to her iron  deficiency anemia.       Review of Systems:  Constitutional: Negative.  HENT: Negative.   Eyes: Negative.   Respiratory: Negative.   Cardiovascular: Negative.   Gastrointestinal: Negative.   Genitourinary: Negative.   Musculoskeletal: Negative.   Skin: Negative.   Neurological: Negative.   Endo/Heme/Allergies: Negative.   Psychiatric/Behavioral: Negative.   All other systems reviewed and are negative.     Allergies   Allergen Reactions    Baloxavir Marboxil Hallucinations     Other Reaction(s): Hallucinations, Hallucinations-Intolerance    Blue Dyes (Parenteral) Anaphylaxis and Angioedema     Other Reaction(s): Angioedema-Allergy    Ibuprofen Swelling     swelling      Nsaids Swelling     Other Reaction(s): Angioedema-Allergy     Red Dye #40 (Allura Red) Anaphylaxis, Angioedema and Hives     Other Reaction(s): Angioedema    Other Swelling     Medication not listed in Epic for Influenza    Other Reaction(s): Joint swelling-Intolerance      Medication not listed in Epic for Influenza    Prochlorperazine Rash     Other Reaction(s): Rash-Allergy    Prochlorperazine Edisylate Rash     Past Medical History:   Diagnosis Date    ADD (attention deficit disorder)     ANA positive     Anti-RNP antibodies present     Arthritis     Depression     Endometriosis     Fibromyalgia     Generalized anxiety disorder     Hypertension     Hyperthyroidism     Irritable bowel syndrome     Lupus     Mononucleosis     Primary insomnia     Vertigo      Past Surgical History:   Procedure Laterality Date    CHOLECYSTECTOMY       Family History   Problem Relation Age of Onset    Breast Cancer Mother 37    Hypertension Mother     Diabetes Mother     Cancer Mother     Heart Disease Father     Mult Sclerosis Sister     Breast Cancer Paternal Aunt 3    Breast  Cancer Cousin 50    Breast Cancer Cousin 15        DCIS    Breast Cancer Cousin 38    Heart Disease Other     Alzheimer's Disease Other      Social History     Socioeconomic History    Marital status: Married     Spouse name: Not on file    Number of children: Not on file    Years of education: Not on file    Highest education level: Not on file   Occupational History    Not on file   Tobacco Use    Smoking status: Never     Passive exposure: Never    Smokeless tobacco: Never   Substance and Sexual Activity    Alcohol use: Yes     Alcohol/week: 1.0 standard drink of alcohol     Types: 1 Glasses of wine per week    Drug use: Yes     Types: Other     Comment: Takes Ativan and Ambien. Never used street drugs.    Sexual activity: Not on file   Other Topics Concern    Not on file   Social History Narrative    Not on file     Social Drivers of Health     Financial Resource Strain: Not At Risk (12/18/2023)    Received from  [facility] - Financial Strain     How hard is it for you to pay for the very basics like food, housing, medical care, and heating? Would you say it is:: Not hard at all   Food Insecurity: No Food Insecurity (12/18/2023)    Received from [facility]    Hunger Vital Sign     Within the past 12 months, you worried that your food would run out before you got the money to buy more.: Never true     Within the past 12 months, the food you bought just didn't last and you didn't have money to get more.: Never true   Transportation Needs: Not At Risk (12/18/2023)    Received from [facility] - Transportation     In the past 12 months, has lack of reliable transportation kept you from medical appointments, meetings, work or from getting things needed for daily living?: No   Physical Activity: Sufficiently Active (12/18/2023)    Received from [facility]    Physical Activity     Days of Exercise per Week: 6 days     On those days that you engage in moderate to vigorous physical activity, how many minutes, on average, do you exercise?: 60     Total Minutes of Exercise Per Week: 360   Stress: No Stress Concern Present (09/04/2023)    Received from [facility]    Stress     Feeling of Stress : Only a little   Social Connections: Not At Risk (12/18/2023)    Received from [facility] - Social Connections     If for any reason you need help with day-to-day activities such as bathing, preparing meals, shopping, managing finances, etc., do you get the help you need?: I don't need any help     How often do you feel lonely or isolated from those around you?: Rarely   Intimate Partner Violence: Not At Risk (11/30/2020)    Received from [facility]    Intimate Partner Violence  Fear of Current or Ex-Partner: No     Emotionally Abused: No     Physically Abused: No     Sexually Abused: No   Housing Stability: Not At Risk (12/18/2023)    Received from [facility] - Inadequate Housing     Current Living  Situation: I have a steady place to live     Think about the place you live. Do you have problems with any of the following?: None of the above     Current Outpatient Medications   Medication Sig Dispense Refill    Plecanatide (TRULANCE) 3 MG TABS Take 1 tablet by mouth daily      hydroxychloroquine  (PLAQUENIL ) 200 MG tablet Take 1 and 1/2 pill once a day after food. 135 tablet 1    amLODIPine (NORVASC) 5 MG tablet Take 1 tablet by mouth daily      montelukast (SINGULAIR) 10 MG tablet Take 1 tablet by mouth nightly      Denosumab  (PROLIA  SC) Inject into the skin #4 given on 10/02/23      Multiple Vitamins-Minerals (CENTRUM ADULT PO) Take by mouth Twice a Week      Cholecalciferol (VITAMIN D3) 125 MCG (5000 UT) TABS Take 1 tablet by mouth daily      melatonin 3 MG TABS tablet Take 1 tablet by mouth daily      EPINEPHrine  (EPIPEN ) 0.3 MG/0.3ML SOAJ injection epinephrine  0.3 mg/0.3 mL injection, auto-injector   INJECT 0.3 ML (0.3 MG) INTO THE MUSCLE ONCE AS NEEDED FOR ANAPHYLAXIS FOR UP TO 1 DOSE      fexofenadine (ALLEGRA ALLERGY) 180 MG tablet Take 1 tablet by mouth daily      famotidine  (PEPCID ) 20 MG tablet Take 1 tablet by mouth daily      Magnesium 300 MG CAPS 1 capsule with a meal Orally Once a day for 30 day(s)      cycloSPORINE (RESTASIS) 0.05 % ophthalmic emulsion Restasis 0.05 % eye drops in a dropperette   INSTILL 1 DROP INTO BOTH EYES TWICE A DAY      LORazepam (ATIVAN) 1 MG tablet Take 1 tablet by mouth 2 times daily as needed. (Patient not taking: Reported on 04/28/2024)      zolpidem (AMBIEN) 5 MG tablet zolpidem 5 mg tablet   TAKE 1 TABLET BY MOUTH NIGHTLY (Patient not taking: Reported on 04/28/2024)       No current facility-administered medications for this visit.       OBJECTIVE:  BP (!) 137/96   Pulse 75   Temp 98.5 F (36.9 C) (Oral)   Resp 22   Ht 1.524 m (5')   Wt 57.4 kg (126 lb 9.6 oz)   SpO2 97%   BMI 24.72 kg/m   Pain Score:   0 - No pain (fatigue-8)     Physical  Exam:  Constitutional: Well developed, well nourished female in no acute distress, sitting comfortably on the examination table.    HEENT: Normocephalic and atraumatic. Sclerae anicteric. Neck supple without JVD. No thyromegaly present.    Skin Warm and dry.  No bruising and no rash noted.  No erythema.  No pallor.    Respiratory Lungs are clear to auscultation bilaterally without wheezes, rales or rhonchi, normal air exchange without accessory muscle use.    CVS Normal rate, regular rhythm and normal S1 and S2.  No murmurs, gallops, or rubs.   Neuro Grossly nonfocal with no obvious sensory or motor deficits.   MSK  Normal range of motion in general.  No edema and no tenderness.   Psych Appropriate mood and affect.      Labs:  Recent Results (from the past 48 hours)   Iron  and TIBC    Collection Time: 04/27/24  1:52 PM   Result Value Ref Range    Iron  54 35 - 100 ug/dL    TIBC 720 759 - 549 ug/dL    Iron  % Saturation 19 (L) 20 - 50 %    UIBC 225.0 112.0 - 347.0 ug/dL   Ferritin    Collection Time: 04/27/24  1:52 PM   Result Value Ref Range    Ferritin 242 8 - 388 NG/ML   Comprehensive Metabolic Panel    Collection Time: 04/27/24  1:52 PM   Result Value Ref Range    Sodium 137 136 - 145 mmol/L    Potassium 4.4 3.5 - 5.1 mmol/L    Chloride 98 98 - 107 mmol/L    CO2 29 20 - 29 mmol/L    Anion Gap 10 7 - 16 mmol/L    Glucose 63 (L) 70 - 99 mg/dL    BUN 24 (H) 6 - 23 MG/DL    Creatinine 9.16 9.39 - 1.10 MG/DL    Est, Glom Filt Rate 82 >60 ml/min/1.25m2    Calcium 9.3 8.8 - 10.2 MG/DL    Total Bilirubin 0.2 0.0 - 1.2 MG/DL    ALT 65 (H) 8 - 45 U/L    AST 39 (H) 15 - 37 U/L    Alk Phosphatase 51 35 - 104 U/L    Total Protein 7.0 6.3 - 8.2 g/dL    Albumin 3.8 3.5 - 5.0 g/dL    Globulin 3.3 2.3 - 3.5 g/dL    Albumin/Globulin Ratio 1.2 1.0 - 1.9     CBC with Auto Differential    Collection Time: 04/27/24  1:52 PM   Result Value Ref Range    WBC 6.2 4.3 - 11.1 K/uL    RBC 4.55 4.05 - 5.2 M/uL    Hemoglobin 14.3 11.7 - 15.4 g/dL     Hematocrit 57.3 64.1 - 46.3 %    MCV 93.6 82 - 102 FL    MCH 31.4 26.1 - 32.9 PG    MCHC 33.6 31.4 - 35.0 g/dL    RDW 88.0 88.0 - 85.3 %    Platelets 299 150 - 450 K/uL    MPV 9.3 (L) 9.4 - 12.3 FL    nRBC 0.00 0.0 - 0.2 K/uL    Differential Type AUTOMATED      Neutrophils % 65.8 43.0 - 78.0 %    Lymphocytes % 27.2 13.0 - 44.0 %    Monocytes % 6.3 4.0 - 12.0 %    Eosinophils % 0.2 (L) 0.5 - 7.8 %    Basophils % 0.3 0.0 - 2.0 %    Immature Granulocytes % 0.2 0.0 - 5.0 %    Neutrophils Absolute 4.09 1.70 - 8.20 K/UL    Lymphocytes Absolute 1.69 0.50 - 4.60 K/UL    Monocytes Absolute 0.39 0.10 - 1.30 K/UL    Eosinophils Absolute 0.01 0.00 - 0.80 K/UL    Basophils Absolute 0.02 0.00 - 0.20 K/UL    Immature Granulocytes Absolute 0.01 0.0 - 0.5 K/UL          Imaging:  No results found.    ASSESSMENT:   Diagnosis Orders   1. Iron  deficiency anemia, unspecified iron  deficiency anemia type  CBC  with Auto Differential    Comprehensive Metabolic Panel    Ferritin    Iron  and TIBC          Patient Active Problem List   Diagnosis    EBV infection    Lymphadenopathy    Osteopenia of multiple sites    Iron  deficiency anemia           PLAN:  IDA: treated in the past with IV iron  at [facility], most recently July 2023 with subsequent improvement.  Recent CBC in February 2024 showed Hgb of 15.1 with normal MCV, no iron  studies performed at that time. Received InFed  in November 2024 with improvement in symptoms and lab parameters.     Assessment & Plan  1. Iron  deficiency anemia.  Hemoglobin and ferritin levels remain within the normal range. However, a decrease in transferrin saturation (TSAT) from 6 months ago has been observed, with the current TSAT at 19%. No iron  infusion is needed at this time. A follow-up appointment is scheduled for 3 months from now to reassess iron  levels, and I suspect she will require iron  replacement fairly soon. Contact the clinic if symptoms worsen before the appointment.    Follow-up  Follow up in 3  months.           Garnette VEAR Barbone, MD   [facility] Hematology and Oncology  [address]  [place], [place] [address]  Office : [phone]  Fax : [phone]     The patient (or guardian, if applicable) and other individuals in attendance with the patient were advised that Artificial Intelligence will be utilized during this visit to record, process the conversation to generate a clinical note, and support improvement of the AI technology. The patient (or guardian, if applicable) and other individuals in attendance at the appointment consented to the use of AI, including the recording.                 "

## 2024-04-28 NOTE — Patient Instructions (Addendum)
 "Patient Information from Today's Visit    Diagnosis: Iron  Deficient Anemia      Follow Up Instructions: Return in 3 months      Treatment Summary has been discussed and given to patient: N/A      Current Labs:   Orders Only on 04/27/2024   Component Date Value Ref Range Status    Iron  04/27/2024 54  35 - 100 ug/dL Final    TIBC 88/96/7974 279  240 - 450 ug/dL Final    Iron  % Saturation 04/27/2024 19 (L)  20 - 50 % Final    UIBC 04/27/2024 225.0  112.0 - 347.0 ug/dL Final    Ferritin 88/96/7974 242  8 - 388 NG/ML Final    Sodium 04/27/2024 137  136 - 145 mmol/L Final    Potassium 04/27/2024 4.4  3.5 - 5.1 mmol/L Final    Chloride 04/27/2024 98  98 - 107 mmol/L Final    CO2 04/27/2024 29  20 - 29 mmol/L Final    Anion Gap 04/27/2024 10  7 - 16 mmol/L Final    Glucose 04/27/2024 63 (L)  70 - 99 mg/dL Final    Comment: <29 mg/dL Consistent with, but not fully diagnostic of hypoglycemia.  100 - 125 mg/dL Impaired fasting glucose/consistent with pre-diabetes mellitus.  > 126 mg/dl Fasting glucose consistent with overt diabetes mellitus      BUN 04/27/2024 24 (H)  6 - 23 MG/DL Final    Creatinine 88/96/7974 0.83  0.60 - 1.10 MG/DL Final    Est, Glom Filt Rate 04/27/2024 82  >60 ml/min/1.10m2 Final    Comment:   Pediatric calculator link: https://www.kidney.org/professionals/kdoqi/gfr_calculatorped    These results are not intended for use in patients <17 years of age.    eGFR results are calculated without a race factor using  the 2021 CKD-EPI equation. Careful clinical correlation is recommended, particularly when comparing to results calculated using previous equations.  The CKD-EPI equation is less accurate in patients with extremes of muscle mass, extra-renal metabolism of creatinine, excessive creatine ingestion, or following therapy that affects renal tubular secretion.      Calcium 04/27/2024 9.3  8.8 - 10.2 MG/DL Final    Total Bilirubin 04/27/2024 0.2  0.0 - 1.2 MG/DL Final    ALT 88/96/7974 65 (H)  8 - 45 U/L  Final    AST 04/27/2024 39 (H)  15 - 37 U/L Final    Alk Phosphatase 04/27/2024 51  35 - 104 U/L Final    Total Protein 04/27/2024 7.0  6.3 - 8.2 g/dL Final    Albumin 88/96/7974 3.8  3.5 - 5.0 g/dL Final    Globulin 88/96/7974 3.3  2.3 - 3.5 g/dL Final    Albumin/Globulin Ratio 04/27/2024 1.2  1.0 - 1.9   Final    WBC 04/27/2024 6.2  4.3 - 11.1 K/uL Final    RBC 04/27/2024 4.55  4.05 - 5.2 M/uL Final    Hemoglobin 04/27/2024 14.3  11.7 - 15.4 g/dL Final    Hematocrit 88/96/7974 42.6  35.8 - 46.3 % Final    MCV 04/27/2024 93.6  82 - 102 FL Final    MCH 04/27/2024 31.4  26.1 - 32.9 PG Final    MCHC 04/27/2024 33.6  31.4 - 35.0 g/dL Final    RDW 88/96/7974 11.9  11.9 - 14.6 % Final    Platelets 04/27/2024 299  150 - 450 K/uL Final    MPV 04/27/2024 9.3 (L)  9.4 - 12.3 FL Final  nRBC 04/27/2024 0.00  0.0 - 0.2 K/uL Final    **Note: Absolute NRBC parameter is now reported with Hemogram**    Differential Type 04/27/2024 AUTOMATED    Final    Neutrophils % 04/27/2024 65.8  43.0 - 78.0 % Final    Lymphocytes % 04/27/2024 27.2  13.0 - 44.0 % Final    Monocytes % 04/27/2024 6.3  4.0 - 12.0 % Final    Eosinophils % 04/27/2024 0.2 (L)  0.5 - 7.8 % Final    Basophils % 04/27/2024 0.3  0.0 - 2.0 % Final    Immature Granulocytes % 04/27/2024 0.2  0.0 - 5.0 % Final    Neutrophils Absolute 04/27/2024 4.09  1.70 - 8.20 K/UL Final    Lymphocytes Absolute 04/27/2024 1.69  0.50 - 4.60 K/UL Final    Monocytes Absolute 04/27/2024 0.39  0.10 - 1.30 K/UL Final    Eosinophils Absolute 04/27/2024 0.01  0.00 - 0.80 K/UL Final    Basophils Absolute 04/27/2024 0.02  0.00 - 0.20 K/UL Final    Immature Granulocytes Absolute 04/27/2024 0.01  0.0 - 0.5 K/UL Final                 Please refer to After Visit Summary or MyChart for upcoming appointment information. If you have any questions regarding your upcoming schedule please reach out to your care team through MyChart or call [phone].    Please notify your assigned Nurse Navigator of any  unplanned hospital admissions or Emergency Room visits within 24 hours of discharge.    -------------------------------------------------------------------------------------------------------------------  Please call our office at [phone] if you have any  of the following symptoms:   Fever of 100.5 or greater  Chills  Shortness of breath  Swelling or pain in one leg    After office hours an answering service is available and will contact a provider for emergencies or if you are experiencing any of the above symptoms.      Caoilainn Sacks  Madilyn, RN    "

## 2024-05-07 NOTE — Telephone Encounter (Signed)
"  Patient due for Prolia  04/02/24.   Submitted request to [facility] to see if PA needed. Not needed for last injection in April.   Case ID# [id number] was submitted on 05-07-2024 by [email or web address]   "

## 2024-05-26 NOTE — Telephone Encounter (Signed)
"  I called and spoke with the pt, she said that the 05/29/24, Friday appt will work better for her. I changed her appt and Sam is going to change her Prolia  injection to Friday as well.   "

## 2024-05-26 NOTE — Telephone Encounter (Signed)
"  PA case was voided on [employer] portal. Reason stated is No Auth Required.   Scheduled patient for her injection on 05/28/24 on same day as OV with Dr. Rupert.   "

## 2024-05-28 ENCOUNTER — Encounter: Payer: PRIVATE HEALTH INSURANCE | Primary: Family Medicine

## 2024-05-28 ENCOUNTER — Encounter: Payer: PRIVATE HEALTH INSURANCE | Attending: Rheumatology | Primary: Family Medicine

## 2024-05-29 ENCOUNTER — Ambulatory Visit: Admit: 2024-05-29 | Discharge: 2024-05-29 | Payer: PRIVATE HEALTH INSURANCE | Primary: Family Medicine

## 2024-05-29 ENCOUNTER — Ambulatory Visit
Admit: 2024-05-29 | Discharge: 2024-05-29 | Payer: PRIVATE HEALTH INSURANCE | Attending: Rheumatology | Primary: Family Medicine

## 2024-05-29 VITALS — BP 120/80 | HR 68 | Ht 60.0 in | Wt 120.8 lb

## 2024-05-29 VITALS — BP 120/80 | HR 68 | Temp 97.40000°F

## 2024-05-29 DIAGNOSIS — M359 Systemic involvement of connective tissue, unspecified: Principal | ICD-10-CM

## 2024-05-29 DIAGNOSIS — M8589 Other specified disorders of bone density and structure, multiple sites: Principal | ICD-10-CM

## 2024-05-29 MED ORDER — DENOSUMAB 60 MG/ML SC SOSY
60 | Freq: Once | SUBCUTANEOUS | Status: AC
Start: 2024-05-29 — End: 2024-05-29
  Administered 2024-05-29: 14:00:00 60 mg via SUBCUTANEOUS

## 2024-05-29 MED ORDER — HYDROXYCHLOROQUINE SULFATE 200 MG PO TABS
200 | ORAL_TABLET | ORAL | 1 refills | 90.00000 days | Status: AC
Start: 2024-05-29 — End: ?

## 2024-05-29 NOTE — Progress Notes (Signed)
"  [facility]  [address]  [place], [place] [address]  Office : [phone], Fax: [phone]       # 5 Prolia  60mg /ml injected SC today into left arm. Patient tolerated injection well. Advised patient to continue with calcium and vitamin D post injection. Advised patient to call with any problems post injection.   Medication ordered by Dr. Machimada    Patient Medication Supplied? no  Patient discharged feeling well and instructed to call the office with any issues.    Pre-infusion/injection questionairre for osteoporosis    1. Have you had or are you planning any dental work? No    2. Are you taking your calcium and vitamin D? Yes    3. When was your last osteoporosis treatment? 10/02/23    4. Have you had any recent fractures? No    5. Are you currently in skilled nursing or in patient rehab? No    "

## 2024-05-29 NOTE — Progress Notes (Signed)
 "[facility] Rheumatology  Danielle Powell, M.D.  [address]   [place], [place] [phone]  Office : [phone], Fax: [phone]     RHEUMATOLOGY OFFICE VISIT NOTE  Date of Visit:  05/29/2024 8:05 AM    Patient Information:  Name:  Danielle Powell  DOB:  [birth year]  Age:  58 y.o.   Gender:  female      [name] is here today for follow-up of undifferentiated connective tissue disease, osteopenia and medication monitoring.  Here for her next Prolia  shot.    Last visit: 02/10/24    History of Present Illness: On talking to the patient today she states that she will check with her hematologist to see if she can get the infusion of iron , since she has felt significantly fatigued and does believe it is secondary to her iron  being low.  Her last eye exam on 10/24/23 at [facility], did not show any evidence of Plaquenil  toxicity.  Patient's current joint complaints are as mentioned below.    Since the last visit, patient is feeling fair.    Pain: 4/10  Location:  Some bilateral arm and shoulder pain. Some neck stiffness with no pain with neck ROM. No tension headaches. Some para spinal muscle pain. Some mid back and shoulder blade pain. Some lower back pain. Some pain and swelling of the MCP and PIP joints with some warmth and redness of the right index finger MCP joint. Some swelling of the wrists with no pain, warmth and redness. Bilateral hip pain laterally with no groin pain. Bilateral knee pain with no swelling, warmth and redness. No buckling of the knees. Some swelling of the ankles with no pain, warmth and redness with no buckling. Some swelling with pain of the right foot on the dorsum with no warmth and redness. Some pain in the arch and heel of both her feet.   Quality:  Deep achy pain.   Modifying Factors:  End of the day the pain is the worst.   Associated Symptoms: Intermittent tingling, numbness and pain down the right arm from the shoulder to the fingertips.   Denies any pain, tingling and numbness radiating down her left upper extremity.  Has a weak grip with no other limitations with her ADL's. No tingling, numbness or pain down her legs with intermittent tingling and numbness of her feet though.        05/29/2024     8:00 AM   DMARD/Biologic   AM Stiffness 30 min   Pain 4   Fatigue 9   MDHAQ 0   Patient Global Score 6   Medication Name Plaquenil      Last TB screen: Years ago  TB result: Always been negative     Current dose of steroids:none  How long on current dose of steroids:NA  How long on continuous steroid therapy:NA     Past DMARDs, if applicable (methotrexate, plaquenil /hydroxychloroquine , sulfasalazine, Arava/leflunomide): Currently taking Plaquenil  200 mg 1 and 1/2 pills once a day.      Past biologics, if applicable (enbrel, humira, simponi, cimzia, xeljanz, orencia, remicade, simponi aria, actemra, rituximab, otezla, stelara, cosentyx): Never taken any of these     Past NSAIDs, if applicable (motrin, aleve, naproxen, advil, ibuprofen, celebrex, voltaren/diclofenac, etc.):none due to angioedema     Last BMD: 03/19/24     IMPRESSION:  Using the World Health Organization criteria the bone mineral density is:  OSTEOPENIA     FRAX:  World Science Writer  meta-analysis fracture risk calculator (FRAX) analysis  was performed for 10 year fracture risk probability assessment.     10 year probability of major osteoporotic fracture: 17.6%  10 year probability of hip fracture: 3.6%      Past osteoporosis drugs, if applicable (fosamax, actonel, boniva, reclast, prolia , forteo):Forteo in the past.  Currently on Prolia  #4 administered to her on 10/02/23.      BMI:23.59  Current exercise regimen, if any:gym 5x weekly  Current vitamin D dose: Vit D3- 5000 IU qd and one MV qd  Current calcium dose: MV qd and in her foods, also takes Tums PRN  Fractures since last visit, if any: None     The patient otherwise has no significant interval changes in health or medical history  to report.     History Reviewed:    Past Medical History  Past Medical History:   Diagnosis Date    ADD (attention deficit disorder)     ANA positive     Anti-RNP antibodies present     Arthritis     Depression     Endometriosis     Fibromyalgia     Fractures 2018,2017,2008    R wrist , left foot    Generalized anxiety disorder     Hypertension     Hyperthyroidism     Irritable bowel syndrome     Low back pain     Lupus     Mononucleosis     Osteoarthritis 04/25/2020    Osteoporosis 04/25/2020    Primary insomnia     Rotator cuff syndrome 11/2021    L    Systemic lupus erythematosus (HCC)     Vertigo        Past Surgical History  Past Surgical History:   Procedure Laterality Date    CHOLECYSTECTOMY         Family History  Family History   Problem Relation Age of Onset    Breast Cancer Mother     Hypertension Mother     Diabetes Mother     Cancer Mother         Breast stage 1    Heart Disease Father 84    Mult Sclerosis Sister     Breast Cancer Paternal Aunt 11    Breast Cancer Cousin 50    Breast Cancer Cousin 13        DCIS    Breast Cancer Cousin 38    Heart Disease Other     Alzheimer's Disease Other        Social History  Social History     Socioeconomic History    Marital status: Married     Spouse name: None    Number of children: None    Years of education: None    Highest education level: None   Tobacco Use    Smoking status: Never     Passive exposure: Never    Smokeless tobacco: Never   Substance and Sexual Activity    Alcohol use: Yes     Alcohol/week: 1.0 standard drink of alcohol     Types: 1 Glasses of wine per week    Drug use: Yes     Types: Other     Comment: Takes Ativan and Ambien. Never used street drugs.     Social Drivers of Health     Financial Resource Strain: Not At Risk (12/18/2023)    Received from [facility] - Financial Strain  How hard is it for you to pay for the very basics like food, housing, medical care, and heating? Would you say it is:: Not hard at all   Food Insecurity:  No Food Insecurity (12/18/2023)    Received from [facility]    Hunger Vital Sign     Within the past 12 months, you worried that your food would run out before you got the money to buy more.: Never true     Within the past 12 months, the food you bought just didn't last and you didn't have money to get more.: Never true   Transportation Needs: Not At Risk (12/18/2023)    Received from [facility] - Transportation     In the past 12 months, has lack of reliable transportation kept you from medical appointments, meetings, work or from getting things needed for daily living?: No   Physical Activity: Sufficiently Active (12/18/2023)    Received from [facility]    Physical Activity     Days of Exercise per Week: 6 days     On those days that you engage in moderate to vigorous physical activity, how many minutes, on average, do you exercise?: 60     Total Minutes of Exercise Per Week: 360   Stress: No Stress Concern Present (09/04/2023)    Received from [facility]    Stress     Feeling of Stress : Only a little   Social Connections: Not At Risk (12/18/2023)    Received from [facility] - Social Connections     If for any reason you need help with day-to-day activities such as bathing, preparing meals, shopping, managing finances, etc., do you get the help you need?: I don't need any help     How often do you feel lonely or isolated from those around you?: Rarely   Intimate Partner Violence: Not At Risk (11/30/2020)    Received from [facility]    Intimate Partner Violence     Fear of Current or Ex-Partner: No     Emotionally Abused: No     Physically Abused: No     Sexually Abused: No   Housing Stability: Not At Risk (12/18/2023)    Received from [facility] - Inadequate Housing     Current Living Situation: I have a steady place to live     Think about the place you live. Do you have problems with any of the following?: None of the above     Allergy:  Allergies   Allergen Reactions     Baloxavir Marboxil Hallucinations     Other Reaction(s): Hallucinations, Hallucinations-Intolerance    Blue Dyes (Parenteral) Anaphylaxis and Angioedema     Other Reaction(s): Angioedema-Allergy    Ibuprofen Swelling     swelling      Nsaids Swelling     Other Reaction(s): Angioedema-Allergy    Red Dye #40 (Allura Red) Anaphylaxis, Angioedema and Hives     Other Reaction(s): Angioedema    Other Swelling     Medication not listed in Epic for Influenza    Other Reaction(s): Joint swelling-Intolerance      Medication not listed in Epic for Influenza    Prochlorperazine Rash     Other Reaction(s): Rash-Allergy    Prochlorperazine Edisylate Rash     Current Medications:  Outpatient Encounter Medications as of 05/29/2024   Medication Sig Dispense Refill    Calcium Carbonate Antacid (TUMS  PO) Take by mouth as needed      hydroxychloroquine  (PLAQUENIL ) 200 MG tablet Take 1 and 1/2 pill once a day after food. 135 tablet 1    Plecanatide (TRULANCE) 3 MG TABS Take 1 tablet by mouth daily      amLODIPine (NORVASC) 5 MG tablet Take 1 tablet by mouth daily      montelukast (SINGULAIR) 10 MG tablet Take 1 tablet by mouth nightly      Denosumab  (PROLIA  SC) Inject into the skin #4 given on 10/02/23      Multiple Vitamins-Minerals (CENTRUM ADULT PO) Take by mouth Twice a Week (Patient taking differently: Take 1 tablet by mouth daily)      Cholecalciferol (VITAMIN D3) 125 MCG (5000 UT) TABS Take 1 tablet by mouth daily      melatonin 3 MG TABS tablet Take 1 tablet by mouth daily      EPINEPHrine  (EPIPEN ) 0.3 MG/0.3ML SOAJ injection epinephrine  0.3 mg/0.3 mL injection, auto-injector   INJECT 0.3 ML (0.3 MG) INTO THE MUSCLE ONCE AS NEEDED FOR ANAPHYLAXIS FOR UP TO 1 DOSE      fexofenadine (ALLEGRA ALLERGY) 180 MG tablet Take 1 tablet by mouth daily      famotidine  (PEPCID ) 20 MG tablet Take 1 tablet by mouth daily      LORazepam (ATIVAN) 1 MG tablet Take 1 tablet by mouth 2 times daily as needed.      zolpidem (AMBIEN) 5 MG tablet  zolpidem 5 mg tablet   TAKE 1 TABLET BY MOUTH NIGHTLY      Magnesium 300 MG CAPS 1 capsule with a meal Orally Once a day for 30 day(s)      cycloSPORINE (RESTASIS) 0.05 % ophthalmic emulsion Restasis 0.05 % eye drops in a dropperette   INSTILL 1 DROP INTO BOTH EYES TWICE A DAY      [DISCONTINUED] hydroxychloroquine  (PLAQUENIL ) 200 MG tablet Take 1 and 1/2 pill once a day after food. 135 tablet 1     No facility-administered encounter medications on file as of 05/29/2024.     REVIEW OF SYSTEMS: The following systems were reviewed with patient today and were negative except for the following (depicted with an X):    X General  X Head and Neck  X Heart and Breathing  X Gastrointestinal    Fever/chills  x Hair loss  x Shortness of breath  x Upset stomach    Falls  x Dry mouth   Coughing   Diarrhea / constipation    Wt loss  x Mouth sores   Wheezing   Heartburn    Wt gain  x Ringing ears   Chest pain   Dark or bloody stools    Night sweats   Diff. swallowing   None of above  x Nausea or vomiting   X None of above   None of above      None of above                X Skin  X Neurology  X Urinary/Gyn  X Other    Easy bruising  x Numbness/ tingling   Female problems  x Depression    Rashes  x Weakness   Problems with urination   Feeling anxious   x Sun sensitivity   Headaches  X None of above  x Problems sleeping    None of above   None of above      None of above     Physical  Exam:  Blood pressure 120/80, pulse 68, height 1.524 m (5'), weight 54.8 kg (120 lb 12.8 oz), SpO2 96%.  General:  Patient alert, cooperative and in no apparent distress.  HEENT: Pupils equally reactive to light and accommodation, no scleral injection noted.  Neck supple, no lymphadenopathy, no thyromegaly.  Heart: Regular rate and rhythm, normal S1 and S2, no rubs or gallops.  Lungs: Clear to auscultation bilaterally.  Abdomen: Soft, nontender, no hepatosplenomegaly.  Skin:  No rashes. No nail abnormalities.  Neurologic:  Oriented,  normal speech and affect.  Normal gait.    Extremities:  No edema in bilateral lower extremities with no cyanosis or clubbing.    Muskoskeletal Exam:     I examined the shoulders, elbows, wrists, MCPs, PIPs, DIPs and knees bilaterally for strength, range of motion, deformity, tenderness, swelling, and synovitis.      The findings are:         Physical Exam              Joint Exam 05/29/2024        Right  Left   Glenohumeral   Tender   Tender   PIP 2 (finger)   Tender   Tender   PIP 3 (finger)   Tender   Tender   PIP 4 (finger)   Tender   Tender   PIP 5 (finger)   Tender   Tender   Cervical Spine   Tender      Lumbar Spine   Tender      Knee   Tender   Tender   Ankle  Swollen   Swollen      Patient otherwise has a normal joint exam without other evidence of joint tenderness, synovitis, warmth, erythema, decreased ROM, weakness or deformities.     Radiology Reports Reviewed (if available):  Last 3 months     MRI BREAST BILATERAL W WO CONTRAST  Narrative: MRI of the Breasts    INDICATION: Episode of right breast bloody nipple discharge. Mother with history  of breast cancer in her 67s. Paternal aunt with history of breast cancer in her  57s.    TECHNIQUE:  Standard MRI sequences were obtained through the breasts in multiple  planes. Images were obtained before and after intravenous infusion of 10 ml of  ProHance .  Images were reviewed with the Dynacad CAD system.    COMPARISON: Bilateral diagnostic mammogram and right breast ultrasound 04/02/2024    FINDINGS:  Tissue density: Scattered fibroglandular.    Background enhancement:  Minimal.    Right breast: There are a few scattered tiny cysts. No suspicious enhancing  masses or areas of non-mass enhancement. No abnormal lymphadenopathy.     Left breast:  There are a few scattered tiny cysts. No suspicious enhancing  masses or areas of non-mass enhancement. No abnormal lymphadenopathy.    No suspicious abnormalities or suspicious enhancement in the visualized chest  or  abdomen.  Impression: No suspicious findings on breast MRI.    RECOMMENDATION:  Screening mammogram in 1 year.    Clinical follow-up if nipple discharge recurs.    BI-RADS Assessment Category 2: Benign finding. (#BRad2)    Electronically signed by DELON MILES         Lab Reports Reviewed (if available): Last 3 months    Orders Only on 04/27/2024   Component Date Value Ref Range Status    Iron  04/27/2024 54  35 - 100 ug/dL Final    TIBC 88/96/7974 279  240 - 450  ug/dL Final    Iron  % Saturation 04/27/2024 19 (L)  20 - 50 % Final    UIBC 04/27/2024 225.0  112.0 - 347.0 ug/dL Final    Ferritin 88/96/7974 242  8 - 388 NG/ML Final    Sodium 04/27/2024 137  136 - 145 mmol/L Final    Potassium 04/27/2024 4.4  3.5 - 5.1 mmol/L Final    Chloride 04/27/2024 98  98 - 107 mmol/L Final    CO2 04/27/2024 29  20 - 29 mmol/L Final    Anion Gap 04/27/2024 10  7 - 16 mmol/L Final    Glucose 04/27/2024 63 (L)  70 - 99 mg/dL Final    Comment: <29 mg/dL Consistent with, but not fully diagnostic of hypoglycemia.  100 - 125 mg/dL Impaired fasting glucose/consistent with pre-diabetes mellitus.  > 126 mg/dl Fasting glucose consistent with overt diabetes mellitus      BUN 04/27/2024 24 (H)  6 - 23 MG/DL Final    Creatinine 88/96/7974 0.83  0.60 - 1.10 MG/DL Final    Est, Glom Filt Rate 04/27/2024 82  >60 ml/min/1.21m2 Final    Comment:   Pediatric calculator link: https://www.kidney.org/professionals/kdoqi/gfr_calculatorped    These results are not intended for use in patients <24 years of age.    eGFR results are calculated without a race factor using  the 2021 CKD-EPI equation. Careful clinical correlation is recommended, particularly when comparing to results calculated using previous equations.  The CKD-EPI equation is less accurate in patients with extremes of muscle mass, extra-renal metabolism of creatinine, excessive creatine ingestion, or following therapy that affects renal tubular secretion.      Calcium 04/27/2024 9.3   8.8 - 10.2 MG/DL Final    Total Bilirubin 04/27/2024 0.2  0.0 - 1.2 MG/DL Final    ALT 88/96/7974 65 (H)  8 - 45 U/L Final    AST 04/27/2024 39 (H)  15 - 37 U/L Final    Alk Phosphatase 04/27/2024 51  35 - 104 U/L Final    Total Protein 04/27/2024 7.0  6.3 - 8.2 g/dL Final    Albumin 88/96/7974 3.8  3.5 - 5.0 g/dL Final    Globulin 88/96/7974 3.3  2.3 - 3.5 g/dL Final    Albumin/Globulin Ratio 04/27/2024 1.2  1.0 - 1.9   Final    WBC 04/27/2024 6.2  4.3 - 11.1 K/uL Final    RBC 04/27/2024 4.55  4.05 - 5.2 M/uL Final    Hemoglobin 04/27/2024 14.3  11.7 - 15.4 g/dL Final    Hematocrit 88/96/7974 42.6  35.8 - 46.3 % Final    MCV 04/27/2024 93.6  82 - 102 FL Final    MCH 04/27/2024 31.4  26.1 - 32.9 PG Final    MCHC 04/27/2024 33.6  31.4 - 35.0 g/dL Final    RDW 88/96/7974 11.9  11.9 - 14.6 % Final    Platelets 04/27/2024 299  150 - 450 K/uL Final    MPV 04/27/2024 9.3 (L)  9.4 - 12.3 FL Final    nRBC 04/27/2024 0.00  0.0 - 0.2 K/uL Final    **Note: Absolute NRBC parameter is now reported with Hemogram**    Differential Type 04/27/2024 AUTOMATED    Final    Neutrophils % 04/27/2024 65.8  43.0 - 78.0 % Final    Lymphocytes % 04/27/2024 27.2  13.0 - 44.0 % Final    Monocytes % 04/27/2024 6.3  4.0 - 12.0 % Final    Eosinophils % 04/27/2024 0.2 (L)  0.5 -  7.8 % Final    Basophils % 04/27/2024 0.3  0.0 - 2.0 % Final    Immature Granulocytes % 04/27/2024 0.2  0.0 - 5.0 % Final    Neutrophils Absolute 04/27/2024 4.09  1.70 - 8.20 K/UL Final    Lymphocytes Absolute 04/27/2024 1.69  0.50 - 4.60 K/UL Final    Monocytes Absolute 04/27/2024 0.39  0.10 - 1.30 K/UL Final    Eosinophils Absolute 04/27/2024 0.01  0.00 - 0.80 K/UL Final    Basophils Absolute 04/27/2024 0.02  0.00 - 0.20 K/UL Final    Immature Granulocytes Absolute 04/27/2024 0.01  0.0 - 0.5 K/UL Final     The results above were reviewed and discussed with patient.       Assessment/Plan:   Notnamed Croucher is a 58 y.o. female who presents with:     Undifferentiated  connective tissue disease: Patient was instructed to continue taking Plaquenil  200 mg a pill and a half once a day after food.  Her last eye exam from 10/24/2023 did not show any evidence of Plaquenil  toxicity.  While on Plaquenil  patient is aware that she would need to keep up with yearly eye exams.  -     hydroxychloroquine  (PLAQUENIL ) 200 MG tablet; Take 1 and 1/2 pill once a day after food.    Osteoporosis of femur without pathological fracture: Greater than 50% of the 40-minute visit was spent reviewing the DEXA results from 03/19/2024 with the patient today.  Since there has been an improvement in the T-score while on Prolia  I will continue her on the Prolia  shot every 6 months for now.  Patient did receive her fifth Prolia  shot today in our office.  She was instructed to continue taking 5000 IUs of vitamin D along with 1 multivitamin a day for now.    Long-term use of high-risk medication: Since patient did have a CBC and CMP done on 04/27/2024 that was reviewed by me with the patient I did not feel the need to repeat those labs again today.    Disease activity plan:  As stated above.    Steroid management plan:  As stated above, if applicable.    Pain management plan:  As stated above, if applicable.    Weight management plan:  Weight loss through diet and exercise is always encouraged    Disease prognosis: Good    I appreciate the opportunity to continue to participate in the care of this patient.     Follow-up and Dispositions    Return in about 4 months (around 09/27/2024).         Electronically signed by:  Danielle Bones, MD      This note was dictated using dragon voice recognition software.  It has been proofread, but there may still exist voice recognition errors that the author did not  detect.                --------------------------------------------------------------------------------------------------------------------------------------------------------------------------------------------------------------------------------    "

## 2024-06-05 ENCOUNTER — Encounter

## 2024-06-05 NOTE — Telephone Encounter (Signed)
"  Chart reviewed by NP.  Returned call to patient.  Informed IDA labs are ordered.  Patient states she will go to walk in lab on Monday.  Instructed to present to ER if symptoms worsen.  Verbalized understanding.  "

## 2024-06-05 NOTE — Telephone Encounter (Signed)
"  Triage Level: Please Advise    Situation    Patient Oncology/ Hematology Diagnosis:IDA    Clinical Question/ Complaint: Patient with c/o extreme fatigue and restless leg.  Reports being pale with some bruising. Patient with c/o palpitations intermittently.  Patient with SOB occasionally.  Denies pain.        Background    Last OV Date & Provider: 04/28/24 Mertha  Is the patient currently receiving treatment?: No  Has the patient previously called about this concern with no improvement within the last 2 weeks?: No    Assessment    Initial Onset of Symptoms: Months ago and gradually increased  Precipitating Factors: unk  Relieving Factors (Medications, etc): an  Has the patient taken all medications prescribed to aid in symptom management?: no      Recommendation    Page Number of Telephone Triage for Oncology Nurses Referenced: na  Plan: Plan pending provider review             "

## 2024-06-08 ENCOUNTER — Encounter

## 2024-06-08 LAB — CBC WITH AUTO DIFFERENTIAL
Basophils %: 0.9 % (ref 0.0–2.0)
Basophils Absolute: 0.05 K/UL (ref 0.00–0.20)
Eosinophils %: 0.7 % (ref 0.5–7.8)
Eosinophils Absolute: 0.04 K/UL (ref 0.00–0.80)
Hematocrit: 43.9 % (ref 35.8–46.3)
Hemoglobin: 14.4 g/dL (ref 11.7–15.4)
Immature Granulocytes %: 0.4 % (ref 0.0–5.0)
Immature Granulocytes Absolute: 0.02 K/UL (ref 0.0–0.5)
Lymphocytes %: 27.5 % (ref 13.0–44.0)
Lymphocytes Absolute: 1.52 K/UL (ref 0.50–4.60)
MCH: 30.6 pg (ref 26.1–32.9)
MCHC: 32.8 g/dL (ref 31.4–35.0)
MCV: 93.2 FL (ref 82–102)
MPV: 9.2 FL — ABNORMAL LOW (ref 9.4–12.3)
Monocytes %: 8.9 % (ref 4.0–12.0)
Monocytes Absolute: 0.49 K/UL (ref 0.10–1.30)
Neutrophils %: 61.6 % (ref 43.0–78.0)
Neutrophils Absolute: 3.41 K/UL (ref 1.70–8.20)
Platelets: 305 K/uL (ref 150–450)
RBC: 4.71 M/uL (ref 4.05–5.2)
RDW: 11.3 % — ABNORMAL LOW (ref 11.9–14.6)
WBC: 5.5 K/uL (ref 4.3–11.1)
nRBC: 0 K/uL (ref 0.0–0.2)

## 2024-06-08 LAB — COMPREHENSIVE METABOLIC PANEL
ALT: 48 U/L — ABNORMAL HIGH (ref 8–45)
AST: 41 U/L — ABNORMAL HIGH (ref 15–37)
Albumin/Globulin Ratio: 1.3 (ref 1.0–1.9)
Albumin: 4 g/dL (ref 3.5–5.0)
Alk Phosphatase: 62 U/L (ref 35–104)
Anion Gap: 10 mmol/L (ref 7–16)
BUN: 23 mg/dL (ref 6–23)
CO2: 28 mmol/L (ref 20–29)
Calcium: 9.6 mg/dL (ref 8.8–10.2)
Chloride: 99 mmol/L (ref 98–107)
Creatinine: 0.82 mg/dL (ref 0.60–1.10)
Est, Glom Filt Rate: 84 ml/min/1.73m2 (ref >60)
Globulin: 3.1 g/dL (ref 2.3–3.5)
Glucose: 68 mg/dL — ABNORMAL LOW (ref 70–99)
Potassium: 4.3 mmol/L (ref 3.5–5.1)
Sodium: 137 mmol/L (ref 136–145)
Total Bilirubin: 0.3 mg/dL (ref 0.0–1.2)
Total Protein: 7.1 g/dL (ref 6.3–8.2)

## 2024-06-08 LAB — IRON AND TIBC
Iron % Saturation: 29 % (ref 20–50)
Iron: 72 ug/dL (ref 35–100)
TIBC: 250 ug/dL (ref 240–450)
UIBC: 178 ug/dL (ref 112.0–347.0)

## 2024-06-08 LAB — FERRITIN: Ferritin: 330 ng/mL (ref 8–388)

## 2024-06-11 ENCOUNTER — Encounter: Payer: PRIVATE HEALTH INSURANCE | Attending: Rheumatology | Primary: Family Medicine

## 2024-06-22 NOTE — Progress Notes (Signed)
 Subjective:     Patient ID: Danielle Powell is a 58 y.o. female.    HPI       This patient well-known to me seems to be doing well from the standpoint of her connective tissue issue she denies any specific issues at this point and she does continue to take Ambien on a as needed basis and does request prescription refills she is up-to-date with UDS as well as consent form    Objective:  Denies any other URI GU or GI symptomatology all other symptoms of systems negative  The following portions of the patient's history were reviewed and updated as appropriate: allergies, current medications, past family history, past medical history, past social history, past surgical history and problem list.        PHQ Depression Screening     PHQ2 Score: 0    PHQ2/9 is interpreted as Negative in this patient           Physical Exam  BP 134/86 (BP Location: Left arm, Patient Position: Sitting)   Pulse 78   Temp 96.2 F (35.7 C)   Ht 152.4 cm (60)   Wt 55.2 kg (121 lb 9.6 oz)   LMP 07/25/2000 (Approximate)   SpO2 97%   BMI 23.75 kg/m afebrile vital signs stable lungs clear heart regular rate rhythm without murmurs gallops or rubs no carotid or aortic bruits noted no thyroid mass or enlargement noted abdomen soft nontender no hepatomegaly noted patient does not appear to be overwhelmed or depressed           Assessment/Plan:   #1:  Hypertension blood pressure stable on amlodipine no stomach issues at this time  #2:  Prescription for Ambien updated she takes this now on a as needed basis this patient when her connective tissue issue is a problem  #3:  ADD stable on Vyvanse patient will continue  #4:  Weight gain patient doing well on Zepbound no specific issues with this medication at this time and no prescription needed she will send me a MyChart message if she wants to go to a higher dose      Problem List Items Addressed This Visit          Cardiovascular and Mediastinum    Hypertension, benign - Primary       Other     Attention deficit disorder (ADD) without hyperactivity    Relevant Medications    zolpidem (AMBIEN CR) 12.5 MG CR tablet     Other Visit Diagnoses         Primary insomnia        Relevant Medications    zolpidem (AMBIEN CR) 12.5 MG CR tablet             This report was generated using voice recognition software and may contain spelling, typographical or grammar errors.

## 2024-07-14 NOTE — Telephone Encounter (Signed)
 OV 05/29/24  Appt 09/28/24      Assessment/Plan:   Danielle Powell is a 59 y.o. female who presents with:      Undifferentiated connective tissue disease: Patient was instructed to continue taking Plaquenil  200 mg a pill and a half once a day after food.  Her last eye exam from 10/24/2023 did not show any evidence of Plaquenil  toxicity.  While on Plaquenil  patient is aware that she would need to keep up with yearly eye exams.  -     hydroxychloroquine  (PLAQUENIL ) 200 MG tablet; Take 1 and 1/2 pill once a day after food.     Osteoporosis of femur without pathological fracture: Greater than 50% of the 40-minute visit was spent reviewing the DEXA results from 03/19/2024 with the patient today.  Since there has been an improvement in the T-score while on Prolia  I will continue her on the Prolia  shot every 6 months for now.  Patient did receive her fifth Prolia  shot today in our office.  She was instructed to continue taking 5000 IUs of vitamin D along with 1 multivitamin a day for now.     Long-term use of high-risk medication: Since patient did have a CBC and CMP done on 04/27/2024 that was reviewed by me with the patient I did not feel the need to repeat those labs again today.

## 2024-07-29 ENCOUNTER — Ambulatory Visit
Admit: 2024-07-29 | Discharge: 2024-07-29 | Payer: PRIVATE HEALTH INSURANCE | Attending: Internal Medicine | Primary: Family Medicine

## 2024-07-29 ENCOUNTER — Encounter

## 2024-07-29 ENCOUNTER — Ambulatory Visit: Payer: PRIVATE HEALTH INSURANCE | Attending: Internal Medicine | Primary: Family Medicine

## 2024-07-29 DIAGNOSIS — D509 Iron deficiency anemia, unspecified: Principal | ICD-10-CM

## 2024-07-29 LAB — CBC WITH AUTO DIFFERENTIAL
Basophils %: 0.3 % (ref 0.0–2.0)
Basophils Absolute: 0.02 10*3/uL (ref 0.00–0.20)
Eosinophils %: 0.9 % (ref 0.5–7.8)
Eosinophils Absolute: 0.07 10*3/uL (ref 0.00–0.80)
Hematocrit: 44.4 % (ref 35.8–46.3)
Hemoglobin: 14.5 g/dL (ref 11.7–15.4)
Immature Granulocytes %: 0.3 % (ref 0.0–5.0)
Immature Granulocytes Absolute: 0.02 10*3/uL (ref 0.0–0.5)
Lymphocytes %: 21.9 % (ref 13.0–44.0)
Lymphocytes Absolute: 1.68 10*3/uL (ref 0.50–4.60)
MCH: 30.7 pg (ref 26.1–32.9)
MCHC: 32.7 g/dL (ref 31.4–35.0)
MCV: 93.9 FL (ref 82–102)
MPV: 9.2 FL (ref 8.8–12.5)
Monocytes %: 8.9 % (ref 4.0–12.0)
Monocytes Absolute: 0.68 10*3/uL (ref 0.10–1.30)
Neutrophils %: 67.7 % (ref 43.0–78.0)
Neutrophils Absolute: 5.19 10*3/uL (ref 1.70–8.20)
Platelets: 297 10*3/uL (ref 150–450)
RBC: 4.73 M/uL (ref 4.05–5.2)
RDW: 12 % (ref 11.9–14.6)
WBC: 7.7 10*3/uL (ref 4.3–11.1)
nRBC: 0 10*3/uL (ref 0.0–0.2)

## 2024-07-29 LAB — IRON AND TIBC
Iron % Saturation: 15 % — ABNORMAL LOW (ref 20–50)
Iron: 41 ug/dL (ref 35–100)
TIBC: 272 ug/dL (ref 240–450)
UIBC: 231 ug/dL (ref 112.0–347.0)

## 2024-07-29 LAB — COMPREHENSIVE METABOLIC PANEL
ALT: 27 U/L (ref 8–45)
AST: 33 U/L (ref 15–37)
Albumin/Globulin Ratio: 1.1 (ref 1.0–1.9)
Albumin: 3.8 g/dL (ref 3.5–5.0)
Alk Phosphatase: 48 U/L (ref 35–104)
Anion Gap: 10 mmol/L (ref 7–16)
BUN: 18 mg/dL (ref 6–23)
CO2: 27 mmol/L (ref 20–29)
Calcium: 9.5 mg/dL (ref 8.8–10.2)
Chloride: 101 mmol/L (ref 98–107)
Creatinine: 0.88 mg/dL (ref 0.60–1.10)
Est, Glom Filt Rate: 77 mL/min/{1.73_m2} (ref >60)
Globulin: 3.6 g/dL — ABNORMAL HIGH (ref 2.3–3.5)
Glucose: 64 mg/dL — ABNORMAL LOW (ref 70–99)
Potassium: 4.2 mmol/L (ref 3.5–5.1)
Sodium: 138 mmol/L (ref 136–145)
Total Bilirubin: 0.3 mg/dL (ref 0.0–1.2)
Total Protein: 7.4 g/dL (ref 6.3–8.2)

## 2024-07-29 LAB — FERRITIN: Ferritin: 388 ng/mL (ref 8–388)

## 2024-07-29 NOTE — Progress Notes (Signed)
 [facility] [place]: Office Visit Established Patient     Chief Complaint:    Chief Complaint   Patient presents with    Follow-up         History of Present Illness:  [name] is a 59 y.o. female who presents today for follow-up regarding iron  deficiency anemia.  She has been previously followed at [facility] hematology, she is receiving intermittent iron  infusions for IDA, most recently in July 2023.  She saw them in November 2023 and her iron  stores were improving so no treatment was given, she has been intolerant to oral iron  historically.  Of note she has a history of connective tissue disease and is followed by rheumatology.  Most recent labs at primary care showed a Hgb of 15.1 with no evidence of microcytosis, iron  studies were not done at that time.  She transferred to [facility] for evaluation due to the [facility] dispute. Received InFed  in November 2024 with improvement in symptoms and lab parameters.      History of Present Illness  The patient, a 59 year old female, presents for follow-up regarding her iron  deficiency anemia.    She reports experiencing worsening fatigue in December, which she attributed to her iron  deficiency anemia. However, laboratory tests conducted at that time showed improvement, with no evidence of progressive iron  deficiency. Repeat labs have been conducted today and are pending. She expresses concern about the potential exacerbation of her irritable bowel syndrome (IBS) symptoms due to oral iron  supplementation. Additionally, she reports a recent feline bite that has resulted in arthralgia and leukocytosis. She sought medical attention from her primary care physician and was prescribed antibiotics.        Review of Systems:  Constitutional: Negative.  HENT: Negative.   Eyes: Negative.   Respiratory: Negative.   Cardiovascular: Negative.   Gastrointestinal: Negative.   Genitourinary: Negative.   Musculoskeletal: Negative.   Skin: Negative.   Neurological: Negative.    Endo/Heme/Allergies: Negative.   Psychiatric/Behavioral: Negative.   All other systems reviewed and are negative.     Allergies   Allergen Reactions    Baloxavir Marboxil Hallucinations     Other Reaction(s): Hallucinations, Hallucinations-Intolerance    Blue Dyes (Parenteral) Anaphylaxis and Angioedema     Other Reaction(s): Angioedema-Allergy    Ibuprofen Swelling     swelling      Nsaids Swelling     Other Reaction(s): Angioedema-Allergy    Red Dye #40 (Allura Red) Anaphylaxis, Angioedema and Hives     Other Reaction(s): Angioedema    Other Swelling     Medication not listed in Epic for Influenza    Other Reaction(s): Joint swelling-Intolerance      Medication not listed in Epic for Influenza    Prochlorperazine Rash     Other Reaction(s): Rash-Allergy    Prochlorperazine Edisylate Rash     Past Medical History:   Diagnosis Date    ADD (attention deficit disorder)     ANA positive     Anti-RNP antibodies present     Arthritis     Depression     Endometriosis     Fibromyalgia     Fractures 2018,2017,2008    R wrist , left foot    Generalized anxiety disorder     Hypertension     Hyperthyroidism     Irritable bowel syndrome     Low back pain     Lupus     Mononucleosis     Osteoarthritis 04/25/2020    Osteoporosis 04/25/2020  Primary insomnia     Rotator cuff syndrome 11/2021    L    Systemic lupus erythematosus (HCC)     Vertigo      Past Surgical History:   Procedure Laterality Date    CHOLECYSTECTOMY       Family History   Problem Relation Age of Onset    Breast Cancer Mother     Hypertension Mother     Diabetes Mother     Cancer Mother         Breast stage 1    Heart Disease Father 83    Mult Sclerosis Sister     Breast Cancer Paternal Aunt 36    Breast Cancer Cousin 5    Breast Cancer Cousin 26        DCIS    Breast Cancer Cousin 38    Heart Disease Other     Alzheimer's Disease Other      Social History     Socioeconomic History    Marital status: Married     Spouse name: Not on file    Number of children:  Not on file    Years of education: Not on file    Highest education level: Not on file   Occupational History    Not on file   Tobacco Use    Smoking status: Never     Passive exposure: Never    Smokeless tobacco: Never   Substance and Sexual Activity    Alcohol use: Yes     Alcohol/week: 1.0 standard drink of alcohol     Types: 1 Glasses of wine per week    Drug use: Yes     Types: Other     Comment: Takes Ativan and Ambien. Never used street drugs.    Sexual activity: Not on file   Other Topics Concern    Not on file   Social History Narrative    Not on file     Social Drivers of Health     Financial Resource Strain: Not At Risk (12/18/2023)    Received from [facility] - Financial Strain     How hard is it for you to pay for the very basics like food, housing, medical care, and heating? Would you say it is:: Not hard at all   Food Insecurity: No Food Insecurity (12/18/2023)    Received from [facility]    Hunger Vital Sign     Within the past 12 months, you worried that your food would run out before you got the money to buy more.: Never true     Within the past 12 months, the food you bought just didn't last and you didn't have money to get more.: Never true   Transportation Needs: Not At Risk (12/18/2023)    Received from [facility] - Transportation     In the past 12 months, has lack of reliable transportation kept you from medical appointments, meetings, work or from getting things needed for daily living?: No   Physical Activity: Sufficiently Active (12/18/2023)    Received from [facility]    Physical Activity     Days of Exercise per Week: 6 days     On those days that you engage in moderate to vigorous physical activity, how many minutes, on average, do you exercise?: 60     Total Minutes of Exercise Per Week: 360   Stress: No Stress Concern Present (09/04/2023)  Received from [facility]    Stress     Feeling of Stress : Only a little   Social Connections: Not At Risk (12/18/2023)     Received from [facility] - Social Connections     If for any reason you need help with day-to-day activities such as bathing, preparing meals, shopping, managing finances, etc., do you get the help you need?: I don't need any help     How often do you feel lonely or isolated from those around you?: Rarely   Intimate Partner Violence: Not At Risk (11/30/2020)    Received from [facility]    Intimate Partner Violence     Fear of Current or Ex-Partner: No     Emotionally Abused: No     Physically Abused: No     Sexually Abused: No   Housing Stability: Not At Risk (12/18/2023)    Received from [facility] - Inadequate Housing     Current Living Situation: I have a steady place to live     Think about the place you live. Do you have problems with any of the following?: None of the above     Current Outpatient Medications   Medication Sig Dispense Refill    Calcium Carbonate Antacid (TUMS PO) Take by mouth as needed      hydroxychloroquine  (PLAQUENIL ) 200 MG tablet Take 1 and 1/2 pill once a day after food. 135 tablet 1    Plecanatide (TRULANCE) 3 MG TABS Take 1 tablet by mouth daily      amLODIPine (NORVASC) 5 MG tablet Take 1 tablet by mouth daily      montelukast (SINGULAIR) 10 MG tablet Take 1 tablet by mouth nightly      Denosumab  (PROLIA  SC) Inject into the skin #4 given on 10/02/23      Multiple Vitamins-Minerals (CENTRUM ADULT PO) Take by mouth Twice a Week (Patient taking differently: Take 1 tablet by mouth daily)      Cholecalciferol (VITAMIN D3) 125 MCG (5000 UT) TABS Take 1 tablet by mouth daily      melatonin 3 MG TABS tablet Take 1 tablet by mouth daily      EPINEPHrine  (EPIPEN ) 0.3 MG/0.3ML SOAJ injection epinephrine  0.3 mg/0.3 mL injection, auto-injector   INJECT 0.3 ML (0.3 MG) INTO THE MUSCLE ONCE AS NEEDED FOR ANAPHYLAXIS FOR UP TO 1 DOSE      fexofenadine (ALLEGRA ALLERGY) 180 MG tablet Take 1 tablet by mouth daily      famotidine  (PEPCID ) 20 MG tablet Take 1 tablet by mouth daily       LORazepam (ATIVAN) 1 MG tablet Take 1 tablet by mouth 2 times daily as needed.      zolpidem (AMBIEN) 5 MG tablet zolpidem 5 mg tablet   TAKE 1 TABLET BY MOUTH NIGHTLY      Magnesium 300 MG CAPS 1 capsule with a meal Orally Once a day for 30 day(s)      cycloSPORINE (RESTASIS) 0.05 % ophthalmic emulsion Restasis 0.05 % eye drops in a dropperette   INSTILL 1 DROP INTO BOTH EYES TWICE A DAY       No current facility-administered medications for this visit.       OBJECTIVE:  BP 129/87 (Patient Position: Sitting)   Pulse 84   Temp 97.6 F (36.4 C) (Oral)   Resp 18   Ht 1.524 m (5')   Wt 53.1 kg (117 lb 1.6 oz)  SpO2 99%   BMI 22.87 kg/m   Pain Score:   0 - No pain (fatigue-8)     Physical Exam:  Constitutional: Well developed, well nourished female in no acute distress, sitting comfortably on the examination table.    HEENT: Normocephalic and atraumatic. Sclerae anicteric. Neck supple without JVD. No thyromegaly present.    Skin Warm and dry.  No bruising and no rash noted.  No erythema.  No pallor. Some erythema of right arm at site of animal bite.   Respiratory Lungs are clear to auscultation bilaterally without wheezes, rales or rhonchi, normal air exchange without accessory muscle use.    CVS Normal rate, regular rhythm and normal S1 and S2.  No murmurs, gallops, or rubs.   Neuro Grossly nonfocal with no obvious sensory or motor deficits.   MSK Normal range of motion in general.  No edema and no tenderness.   Psych Appropriate mood and affect.      Labs:  No results found for this or any previous visit (from the past 48 hours).         Imaging:  No results found.    ASSESSMENT:   Diagnosis Orders   1. Iron  deficiency anemia, unspecified iron  deficiency anemia type  CBC with Auto Differential    Comprehensive Metabolic Panel    Ferritin    Iron  and TIBC    CBC with Auto Differential    Comprehensive Metabolic Panel    Ferritin    Iron  and TIBC            Patient Active Problem List   Diagnosis    EBV  infection    Lymphadenopathy    Osteopenia of multiple sites    Iron  deficiency anemia           PLAN:  IDA: treated in the past with IV iron  at [facility], most recently July 2023 with subsequent improvement.  Recent CBC in February 2024 showed Hgb of 15.1 with normal MCV, no iron  studies performed at that time. Received InFed  in November 2024 with improvement in symptoms and lab parameters.     Assessment & Plan  1. Iron  deficiency anemia.  Laboratory results from 05/2024 were within normal limits, indicating no need for intravenous iron  supplementation at that time. Oral iron  supplementation could be considered if current labs are consistent with previous results, but is not necessary. Her GI intolerance in the past would discourage her from oral iron  unless absolutely necessary.. Notification via MyChart will be provided once the results are available. A follow-up appointment has been scheduled for 3 months from now.    2. Cat bite.  A recent cat bite has led to symptoms affecting joints and a red line up the arm. Antibiotics have been prescribed by the primary care physician.    Follow-up  Follow-up in 3 months.            Garnette VEAR Barbone, MD   [facility]  [address]  [place], [place] [address]  Office : [phone]  Fax : [phone]     The patient (or guardian, if applicable) and other individuals in attendance with the patient were advised that Artificial Intelligence will be utilized during this visit to record, process the conversation to generate a clinical note, and support improvement of the AI technology. The patient (or guardian, if applicable) and other individuals in attendance at the appointment consented to the use of AI, including the recording.

## 2024-07-29 NOTE — Patient Instructions (Signed)
 Patient Information from Today's Visit    The members of your [facility] are listed below:    Physician Provider: Dr. Garnette Barbone  Advanced Practice Clinician: Maryelizabeth Cos  Registered Nurse: [place]  S., RN   Nurse Navigator: N/A  Medical Assistant: Lauraine Tari HERO.   Supportive Care Services: Kallison G., LSMW    Diagnosis (Information Sheet Provided on Day of Diagnosis): IDA    Follow Up Instructions:     Follow up as scheduled.     Has Treatment Plan Been Finalized? Yes - see prior treatment plan documentation    Current Labs: N/A      Please refer to After Visit Summary or MyChart for upcoming appointment information. Please call our office for rescheduling needs at least 24 hours before your scheduled appointment time.If you have any questions regarding your upcoming schedule please reach out to your care team through MyChart or call [phone].     Please notify your assigned Nurse Navigator of any unplanned hospital admissions or Emergency Room visits within 24 hours of discharge.    -------------------------------------------------------------------------------------------------------------------  Please call our office at [phone] if you have any  of the following symptoms:   Fever of 100.5 or greater  Chills  Shortness of breath  Swelling or pain in one leg    After office hours an answering service is available and will contact a provider for emergencies or if you are experiencing any of the above symptoms.    Rogelio Waynick JINNY RICHARDS, RN
